# Patient Record
Sex: Male | Born: 1970
Health system: Southern US, Community
[De-identification: ages and names within clinical notes are randomized; demographics above are authoritative.]

## PROBLEM LIST (undated history)

## (undated) DIAGNOSIS — J309 Allergic rhinitis, unspecified: Secondary | ICD-10-CM

## (undated) DIAGNOSIS — D509 Iron deficiency anemia, unspecified: Secondary | ICD-10-CM

## (undated) DIAGNOSIS — K648 Other hemorrhoids: Secondary | ICD-10-CM

## (undated) DIAGNOSIS — E785 Hyperlipidemia, unspecified: Secondary | ICD-10-CM

## (undated) DIAGNOSIS — K922 Gastrointestinal hemorrhage, unspecified: Secondary | ICD-10-CM

## (undated) DIAGNOSIS — G8929 Other chronic pain: Secondary | ICD-10-CM

## (undated) DIAGNOSIS — M549 Dorsalgia, unspecified: Secondary | ICD-10-CM

## (undated) DIAGNOSIS — Z9289 Personal history of other medical treatment: Secondary | ICD-10-CM

## (undated) HISTORY — DX: Gastrointestinal hemorrhage, unspecified: K92.2

## (undated) HISTORY — PX: COLONOSCOPY: SHX174

## (undated) HISTORY — PX: ESOPHAGOGASTRODUODENOSCOPY: SHX1529

## (undated) HISTORY — DX: Dorsalgia, unspecified: M54.9

## (undated) HISTORY — DX: Other chronic pain: G89.29

## (undated) HISTORY — DX: Hyperlipidemia, unspecified: E78.5

## (undated) HISTORY — DX: Allergic rhinitis, unspecified: J30.9

## (undated) HISTORY — DX: Personal history of other medical treatment: Z92.89

## (undated) HISTORY — DX: Iron deficiency anemia, unspecified: D50.9

## (undated) HISTORY — PX: OTHER SURGICAL HISTORY: SHX169

## (undated) HISTORY — DX: Other hemorrhoids: K64.8

---

## 2005-11-25 ENCOUNTER — Ambulatory Visit: Payer: Self-pay

## 2011-12-30 DIAGNOSIS — K922 Gastrointestinal hemorrhage, unspecified: Secondary | ICD-10-CM

## 2011-12-30 HISTORY — PX: HEMORRHOID BANDING: SHX5850

## 2011-12-30 HISTORY — DX: Gastrointestinal hemorrhage, unspecified: K92.2

## 2012-02-27 ENCOUNTER — Ambulatory Visit: Payer: Self-pay | Admitting: Internal Medicine

## 2012-03-10 ENCOUNTER — Inpatient Hospital Stay: Payer: Self-pay | Admitting: *Deleted

## 2012-03-10 LAB — COMPREHENSIVE METABOLIC PANEL
Albumin: 3.9 g/dL (ref 3.4–5.0)
Alkaline Phosphatase: 50 U/L (ref 50–136)
BUN: 10 mg/dL (ref 7–18)
Bilirubin,Total: 0.7 mg/dL (ref 0.2–1.0)
Calcium, Total: 8.5 mg/dL (ref 8.5–10.1)
Chloride: 105 mmol/L (ref 98–107)
Creatinine: 0.82 mg/dL (ref 0.60–1.30)
EGFR (African American): >60
EGFR (Non-African Amer.): >60
Glucose: 89 mg/dL (ref 65–99)
SGPT (ALT): 22 U/L
Total Protein: 7 g/dL (ref 6.4–8.2)

## 2012-03-10 LAB — URINALYSIS, COMPLETE
Bacteria: NONE SEEN
Blood: NEGATIVE
Glucose,UR: NEGATIVE mg/dL (ref 0–75)
Nitrite: NEGATIVE
Ph: 7 (ref 4.5–8.0)
Protein: NEGATIVE
Specific Gravity: 1.012 (ref 1.003–1.030)
WBC UR: 1 /HPF (ref 0–5)

## 2012-03-10 LAB — CBC
MCH: 15.7 pg — ABNORMAL LOW (ref 26.0–34.0)
RBC: 2.64 10*6/uL — ABNORMAL LOW (ref 4.40–5.90)
RDW: 19.4 % — ABNORMAL HIGH (ref 11.5–14.5)
WBC: 7.1 10*3/uL (ref 3.8–10.6)

## 2012-03-10 LAB — IRON AND TIBC
Iron Bind.Cap.(Total): 512 ug/dL — ABNORMAL HIGH (ref 250–450)
Iron Saturation: 2 %

## 2012-03-10 LAB — TROPONIN I: Troponin-I: <0.02 ng/mL

## 2012-03-11 LAB — CBC WITH DIFFERENTIAL/PLATELET
Eosinophil #: 0.1 10*3/uL (ref 0.0–0.7)
Eosinophil %: 1.8 %
Lymphocyte #: 2.1 10*3/uL (ref 1.0–3.6)
Lymphocyte %: 31.8 %
MCV: 63 fL — ABNORMAL LOW (ref 80–100)
Monocyte #: 0.6 10*3/uL (ref 0.0–0.7)
Monocyte %: 9.3 %
Neutrophil %: 56.2 %
Platelet: 262 10*3/uL (ref 150–440)
RBC: 3.02 10*6/uL — ABNORMAL LOW (ref 4.40–5.90)
RDW: 27.6 % — ABNORMAL HIGH (ref 11.5–14.5)
WBC: 6.7 10*3/uL (ref 3.8–10.6)

## 2012-03-11 LAB — COMPREHENSIVE METABOLIC PANEL
Albumin: 3.4 g/dL (ref 3.4–5.0)
Alkaline Phosphatase: 43 U/L — ABNORMAL LOW (ref 50–136)
Anion Gap: 12 (ref 7–16)
BUN: 8 mg/dL (ref 7–18)
Calcium, Total: 8.2 mg/dL — ABNORMAL LOW (ref 8.5–10.1)
Chloride: 106 mmol/L (ref 98–107)
EGFR (Non-African Amer.): >60
Potassium: 4 mmol/L (ref 3.5–5.1)
SGOT(AST): 14 U/L — ABNORMAL LOW (ref 15–37)
Total Protein: 6.2 g/dL — ABNORMAL LOW (ref 6.4–8.2)

## 2012-03-12 LAB — CBC WITH DIFFERENTIAL/PLATELET
Basophil #: 0.1 10*3/uL (ref 0.0–0.1)
Basophil %: 1.2 %
Eosinophil #: 0.1 10*3/uL (ref 0.0–0.7)
HCT: 22.3 % — ABNORMAL LOW (ref 40.0–52.0)
HGB: 6.7 g/dL — ABNORMAL LOW (ref 13.0–18.0)
Lymphocyte %: 22.3 %
MCHC: 30.2 g/dL — ABNORMAL LOW (ref 32.0–36.0)
Neutrophil %: 68.2 %
Platelet: 249 10*3/uL (ref 150–440)
RDW: 30.4 % — ABNORMAL HIGH (ref 11.5–14.5)
WBC: 7.5 10*3/uL (ref 3.8–10.6)

## 2012-03-12 LAB — HEPATIC FUNCTION PANEL A (ARMC)
Alkaline Phosphatase: 43 U/L — ABNORMAL LOW (ref 50–136)
Bilirubin,Total: 1.1 mg/dL — ABNORMAL HIGH (ref 0.2–1.0)
SGOT(AST): 11 U/L — ABNORMAL LOW (ref 15–37)
SGPT (ALT): 17 U/L

## 2012-03-13 LAB — COMPREHENSIVE METABOLIC PANEL
Albumin: 3.4 g/dL (ref 3.4–5.0)
Alkaline Phosphatase: 43 U/L — ABNORMAL LOW (ref 50–136)
Anion Gap: 8 (ref 7–16)
Bilirubin,Total: 0.8 mg/dL (ref 0.2–1.0)
Calcium, Total: 8.1 mg/dL — ABNORMAL LOW (ref 8.5–10.1)
Chloride: 110 mmol/L — ABNORMAL HIGH (ref 98–107)
Co2: 24 mmol/L (ref 21–32)
Creatinine: 0.68 mg/dL (ref 0.60–1.30)
EGFR (African American): >60
EGFR (Non-African Amer.): >60
Glucose: 88 mg/dL (ref 65–99)
Osmolality: 280 (ref 275–301)
Potassium: 3.8 mmol/L (ref 3.5–5.1)
Sodium: 142 mmol/L (ref 136–145)
Total Protein: 6.1 g/dL — ABNORMAL LOW (ref 6.4–8.2)

## 2012-03-13 LAB — CBC WITH DIFFERENTIAL/PLATELET
Eosinophil %: 5.7 %
HCT: 24.8 % — ABNORMAL LOW (ref 40.0–52.0)
HGB: 7.3 g/dL — ABNORMAL LOW (ref 13.0–18.0)
Lymphocyte #: 1.6 10*3/uL (ref 1.0–3.6)
Lymphocyte %: 25 %
MCH: 20.4 pg — ABNORMAL LOW (ref 26.0–34.0)
MCHC: 29.6 g/dL — ABNORMAL LOW (ref 32.0–36.0)
MCV: 69 fL — ABNORMAL LOW (ref 80–100)
Monocyte #: 0.6 10*3/uL (ref 0.0–0.7)
Monocyte %: 8.8 %
Neutrophil %: 60.2 %
Platelet: 219 10*3/uL (ref 150–440)
RDW: 30.4 % — ABNORMAL HIGH (ref 11.5–14.5)
WBC: 6.4 10*3/uL (ref 3.8–10.6)

## 2012-03-15 ENCOUNTER — Ambulatory Visit: Payer: Self-pay | Admitting: Internal Medicine

## 2012-03-15 LAB — PATHOLOGY REPORT

## 2012-03-19 LAB — RETICULOCYTES: Reticulocyte: 2.3 % — ABNORMAL HIGH (ref 0.5–1.5)

## 2012-03-29 ENCOUNTER — Ambulatory Visit: Payer: Self-pay | Admitting: Internal Medicine

## 2012-03-31 LAB — CANCER CENTER HEMOGLOBIN: HGB: 10.6 g/dL — ABNORMAL LOW (ref 13.0–18.0)

## 2012-03-31 LAB — BASIC METABOLIC PANEL
BUN: 11 mg/dL (ref 7–18)
Chloride: 103 mmol/L (ref 98–107)
Co2: 31 mmol/L (ref 21–32)
Creatinine: 0.97 mg/dL (ref 0.60–1.30)
EGFR (Non-African Amer.): >60
Glucose: 87 mg/dL (ref 65–99)
Potassium: 3.7 mmol/L (ref 3.5–5.1)

## 2012-03-31 LAB — MAGNESIUM: Magnesium: 1.8 mg/dL

## 2012-04-14 LAB — CBC CANCER CENTER
Basophil %: 0.8 %
Eosinophil #: 0.2 x10 3/mm (ref 0.0–0.7)
Eosinophil %: 2.1 %
HCT: 32.1 % — ABNORMAL LOW (ref 40.0–52.0)
HGB: 9.7 g/dL — ABNORMAL LOW (ref 13.0–18.0)
MCHC: 30.2 g/dL — ABNORMAL LOW (ref 32.0–36.0)
Monocyte #: 0.6 x10 3/mm (ref 0.2–1.0)
Monocyte %: 8.6 %
Neutrophil #: 4.7 x10 3/mm (ref 1.4–6.5)
Platelet: 342 x10 3/mm (ref 150–440)
WBC: 7.6 x10 3/mm (ref 3.8–10.6)

## 2012-04-28 ENCOUNTER — Ambulatory Visit: Payer: Self-pay | Admitting: Internal Medicine

## 2012-04-29 LAB — CANCER CENTER HEMOGLOBIN: HGB: 12.5 g/dL — ABNORMAL LOW (ref 13.0–18.0)

## 2012-05-12 LAB — CANCER CENTER HEMOGLOBIN: HGB: 11.7 g/dL — ABNORMAL LOW (ref 13.0–18.0)

## 2012-05-26 LAB — CANCER CENTER HEMOGLOBIN: HGB: 11.1 g/dL — ABNORMAL LOW (ref 13.0–18.0)

## 2012-05-26 LAB — FERRITIN: Ferritin (ARMC): 13 ng/mL (ref 8–388)

## 2012-05-29 ENCOUNTER — Ambulatory Visit: Payer: Self-pay | Admitting: Internal Medicine

## 2012-07-14 DIAGNOSIS — K648 Other hemorrhoids: Secondary | ICD-10-CM | POA: Insufficient documentation

## 2012-07-14 DIAGNOSIS — D649 Anemia, unspecified: Secondary | ICD-10-CM | POA: Insufficient documentation

## 2013-01-18 IMAGING — NM NUCLEAR MEDICINE MECKELS SCAN
2 series · 13 of 13 positions shown · non-contrast
Comparison: none

REASON FOR EXAM: GI blood loss of undetermined origin with neg EGD and
colonoscopy
COMMENTS:

[Series 1000: (person_name) statics · 2.40mm/px · 7 of 7 slices shown]
[im 1/7]
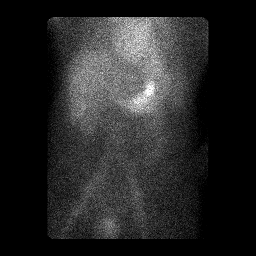
[im 2/7]
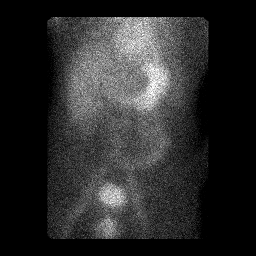
[im 3/7]
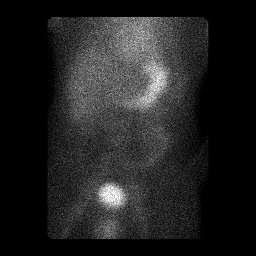
[im 4/7]
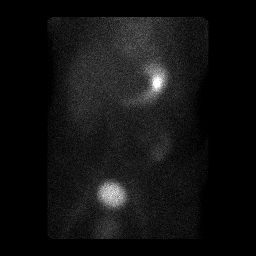
[im 5/7]
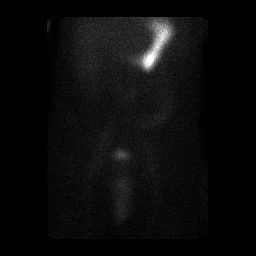
[im 6/7]
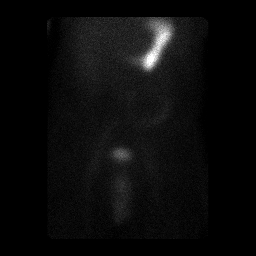
[im 7/7]
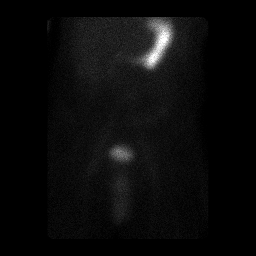

[Series 1000: (person_name) dynamic · 4.80mm/px · 6 of 12 frames shown]
[frame 2/12]
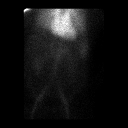
[frame 4/12]
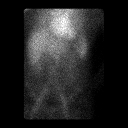
[frame 6/12]
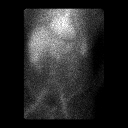
[frame 8/12]
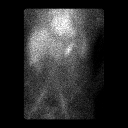
[frame 10/12]
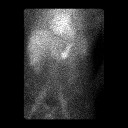
[frame 12/12]
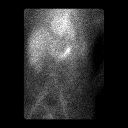

[13 of 13 positions shown; findings below may reference images not displayed]

PROCEDURE:     NM  - NM SRZ LOCALIZATION SCAN  - [DATE] [DATE] [DATE]  [DATE]

RESULT:     Following a trace amount straight and 15 mCi technetium 99 M
pertechnetate, serial views 11 retained up to one hour. There is observed
normal distribution of tracer activity in the stomach , duodenum and the
urinary bladder. No abnormal focal areas of increased tracer activity
suspicious for Shim diverticulum identified.
IMPRESSION: 1. Normal study. No Meckel's diverticulum is identified.

## 2015-04-22 NOTE — Consult Note (Signed)
Chief Complaint:   Subjective/Chief Complaint Feels well. No BM. Hemoglobin better at 7.3. Capsule study in progress.  Recommendations: Regular diet this evening. May go home today after the capsule study is complete. Patient should call Dr. Percell [place] office Monday to make an appointment within 2-3 days.   VITAL SIGNS/ANCILLARY NOTES: **Vital Signs.:   16-Mar-13 09:44   Vital Signs Type Q 4hr   Temperature Temperature (F) 98.2   Celsius 36.7   Temperature Source oral   Pulse Pulse 63   Pulse source per Dinamap   Respirations Respirations 20   Systolic BP Systolic BP 850   Diastolic BP (mmHg) Diastolic BP (mmHg) 67   Mean BP 83   BP Source Dinamap   Pulse Ox % Pulse Ox % 97   Pulse Ox Activity Level  At rest   Oxygen Delivery Room Air/ 21 %   Electronic Signatures: Jill Side (MD)  (Signed 16-Mar-13 13:18)  Authored: Chief Complaint, VITAL SIGNS/ANCILLARY NOTES   Last Updated: 16-Mar-13 13:18 by Jill Side (MD)

## 2015-04-22 NOTE — Discharge Summary (Signed)
PATIENT NAME:  Chris [name], Chris [name] MR#:  [id number] DATE OF BIRTH:  1971/08/26  DATE OF ADMISSION:  03/10/2012 DATE OF DISCHARGE:  03/13/2012  DISCHARGE DIAGNOSIS: Severe iron deficiency anemia status post packed red blood cells transfusion status post upper and lower GI endoscopy.   CONSULTATIONS:  1. GI, Dr. Vira Agar. 2. Hematology, Dr. Inez Pilgrim.   PROCEDURES DONE: Upper GI endoscopy, colonoscopy and capsule endoscopy.   HOSPITAL COURSE: A 44 year old male with no significant past medical history. He was referred from [facility] because of anemia. He was recently having some cough and cold symptoms and lab studies were done at [facility] showed severe anemia and patient was sent to the Emergency Room. He was complaining of some mild rectal bleeding off and on when he wipes. He was admitted as severe iron deficiency anemia with possible GI blood loss. When he came in his hemoglobin was found to be 4.1 microcytic with MCV of 57. His platelet count and white count were normal; white count 7.1 and platelet count 328,000. When he came in his BUN was normal at 10. LFTs are essentially normal. Iron studies showed that he had ferritin of less than 1, TIBC elevated at 512, serum iron 8 mcg/dL and iron saturation of only 2%. GI was consulted. His guaiac was negative. Dr. Candace Cruise saw him as GI consultant. Patient was taken for upper GI endoscopy in 03/11/2012 that was essentially normal, normal esophagus, normal stomach and normal duodenum. Patient was taken for colonoscopy on 03/12/2012 and only showed prominent internal hemorrhoids. Distal terminal ileum showed some lymphoid hyperplasia and was biopsied. The biopsy is pending. Celiac panel and Meckel's scan was done because of severe iron deficiency anemia. The celiac disease panel came back as negative. His Meckel's scan is negative, normal study. His chest x-ray when he came in was negative. He had beta strep throat culture that was done, negative. Hematology  was also consulted because of his severe iron deficiency anemia. He does not seem to have a hematological problem going on. There is no sign or suspicion for any hematological problems. He does not need IV iron right now. He suggested to start the patient on a combination of iron polysaccharide and ferrous fumarate tandem. I am going to start him on that along with multivitamin Tandem Plus. He received a total of 4 units of packed RBC transfusion during the hospital stay. His hemoglobin had improved to 7.3. He is hemodynamically stable. He is saturating on room air.Marland Kitchen His hemoglobin needs to be monitored when he follows with GI and hematology. He is getting a capsule endoscopy at the time of discharge.   MEDICATIONS AT DISCHARGE/HOME MEDICATIONS:  1. Allegra-D 180 mg daily. 2. Nasonex 2 sprays nasal once a day at bedtime.  3. New medications:  4. Tandem Plus 1 capsule p.o. b.i.d. which is a combination of iron polysaccharide, ferrous fumarate and multivitamin.    DIET: Eat light for the first meal.   CONDITION AT DISCHARGE: Stable, comfortable. T-max 98.2, heart rate 67, blood pressure 118/71, saturating 98% on room air. Chest is clear. Abdomen soft, nontender.   FOLLOW UP: Patient should follow up with Dr. In[facility] Pilgrim on [facility]. Please call for appointment on Monday and follow up with Dr. Vira Agar in 2 to 3 days.   TIME SPENT WITH DISCHARGE: 40 minutes.  ____________________________ Mena Pauls, MD ag:cms D: 03/13/2012 16:05:06 ET T: 03/15/2012 10:52:23 ET JOB#: [id number]  cc: Mena Pauls, MD, <Dictator> Simonne Come. Inez Pilgrim, MD Gavin Pound.  Vira Agar, MD Mena Pauls MD ELECTRONICALLY SIGNED 03/23/2012 15:11

## 2015-04-22 NOTE — Consult Note (Signed)
Pt colonoscopy showed only prominent internal hemorrhoids.  Distal terminal ileum showed lymphoid hyperplasia and was biopsied. to severe iron def will consult hematology, check celiac panel, do Meckel scan today and capsule study tomorrow.  Discussed with patient and wife.  Electronic Signatures: Manya Silvas (MD)  (Signed on 15-Mar-13 12:53)  Authored  Last Updated: 15-Mar-13 12:53 by Manya Silvas (MD)

## 2015-04-22 NOTE — Consult Note (Signed)
Full consult to follow. Rectal bleeding over few months. Recalls having colonoscopy 8-9 yrs ago. Pt does not know the results, nor can we find report in hospital records. Occasional NSAIDS use. Transfused 2 units last night. Another unit being given today. Hx suggests more LGI bleed than UGI. Will try to locate old colonoscopy report. May consider EGD 1st today. If neg, then bowel prep later today for colon tomorrow. THanks.  Electronic Signatures: Verdie Shire (MD)  (Signed on 14-Mar-13 08:05)  Authored  Last Updated: 14-Mar-13 08:05 by Verdie Shire (MD)

## 2015-04-22 NOTE — Consult Note (Signed)
PATIENT NAME:  Chris [name], Chris [name] MR#:  [id number] DATE OF BIRTH:  May 01, 1971  DATE OF CONSULTATION:  03/12/2012  REFERRING PHYSICIAN:   CONSULTING PHYSICIAN:  Simonne Come. Inez Pilgrim, MD  HISTORY OF PRESENT ILLNESS: [name] is a 44 year old patient who was admitted on 03/10/2012 with significant anemia and mildly symptomatic. He admitted to having some lightheadedness and shortness of breath on exertion when he only carried his daughter upstairs with symptoms for a few weeks and on questioning admitted to reddish stools for a few weeks, dark stools on occasion, not too long ago. Using Advil p.r.n. recently. One episode of forceful heartbeat or palpitation a few weeks earlier and when he had labs done at [facility], because of these symptoms, his hemoglobin was 3.9. His iron and ferritin levels were extremely low. He was sent to the hospital. He was initially transfused two and then a third unit of blood. Hemoglobin came up to 6.7. He was then transfused a fourth unit of blood after which I evaluated him. I discussed his case with his primary physician, Dr. Holley Raring, and with Dr. Vira Agar. The patient had already undergone a colonoscopy and an EGD. He had internal hemorrhoids. It was thought to be the source of bleeding. He also had some lymphoid hyperplasia in one area that was biopsied and that result is pending. He tolerated the procedures well. He again was only minimally symptomatic even with the low hemoglobin and at rest not ever been symptomatic and when I saw him he felt completely normal, vigorous. He otherwise denied any headache, dizziness, vertigo, visual disturbances, ear or jaw pain, cough, shortness of breath, chest pain, palpitations, wheezing, hemoptysis, back pain, bone pain, abdominal pain, nausea, vomiting, diarrhea, edema, joint swelling, rash, bruising, or focal weakness.   PAST MEDICAL HISTORY: He has had positive stools and sigmoidoscopy years ago.   DRUG ALLERGIES: No known drug  allergies.   MEDICATIONS: He was recently taking some Nasonex and Allegra D.   REVIEW OF SYSTEMS: He has recently just had cold symptoms with some nonproductive cough. No other pharyngitis. He complained of a dry throat briefly. He had his throat cultured and did not have fever; this is really self-limiting.  PAST SURGICAL HISTORY:  No prior surgeries.   FAMILY HISTORY: No cancer or blood disorders in the family.   SOCIAL HISTORY: No alcohol. No smoking.   PHYSICAL EXAMINATION:   GENERAL: He was alert and cooperative, in no acute distress.   EYES: Sclerae no jaundice.   MOUTH: No thrush.   NECK: No palpable lymph nodes in the supraclavicular, submandibular, or axillae.   LUNGS: Clear. No wheezing or rales.   HEART: Regular.   ABDOMEN: Nontender. No palpable mass or organomegaly.   EXTREMITIES: No extremity edema. No swollen or deformed joints.   NEUROLOGIC: Grossly nonfocal. Cranial nerves are intact. Moving all extremities against gravity. I did not test his gait.   PSYCHIATRIC: Affect normal.   LABS/STUDIES: EKG in the ER was reported as normal.   As an outpatient, his hemoglobin was 3.9 with MCV of 61. The calcium was slightly low at 8.2 and vitamin D was low. He had normal electrolytes and liver chemistries and a troponin. His white count was 7.1, hemoglobin was 4.1, platelets 328, and MCV was 57.   He then had transfusion and hemoglobin came up to 6.7.   He has undergone colonoscopy and endoscopy already as noted.   IMPRESSION AND PLAN: The patient is with clear iron deficiency, low oral, low ferritin,  and low MCV. He has been transfused blood. He does not need any additional transfusions. He is very comfortable at rest. He is followed by gastroenterology and they planned Meckel's diverticulum scan which was actually done and reported normal. He is planned for capsule study. He will have appropriate GI follow-up depending on if a lesion is found or if the scan is  negative. He will have follow-up of the biopsy of the lymphoid hyperplasia. He will have calcium checked and be given appropriate vitamin D replacement and that will be deferred to Medicine. From the hematology point of view, there is no sign or suspicion currently that he has any hematology of blood or bone marrow disease, except that resulting from gastrointestinal blood loss. There is no indication for IV iron now. He will need to start p.o. iron which he has been cleared to do by Gastroenterology after his capsule study and he will start that on Sunday. He should take iron twice a day initially. I will see him in the [place] within a few days of his discharge and then check his hemoglobin to confirm that it is improving. He will need to be followed to confirm that his hemoglobin does indeed normalize on oral iron and then we would be certain that there is no other underlying or other contributing causes of anemia. Then would defer to Gastroenterology whether he should have repeat luminal studies at some time or if the findings and the history are compatible with his degree of blood loss. I discussed these issues with the patient and his wife.  ____________________________ Simonne Come. Inez Pilgrim, MD rgg:slb D: 03/13/2012 11:20:00 ET T: 03/13/2012 12:28:33 ET JOB#: [id number]  cc: Simonne Come. Inez Pilgrim, MD, <Dictator> Dallas Schimke MD ELECTRONICALLY SIGNED 03/19/2012 13:47

## 2015-04-22 NOTE — Consult Note (Signed)
Brief Consult Note: Diagnosis: ida from chronic and subacute gi bleed.   Patient was seen by consultant.   Discussed with Attending MD.   Comments: See dictated note to follow  discussed with dr Holley Raring and dr Vira Agar. no evidence or suspicion currently of other underlying hematologic disease. Will need to start po iron on sunday after capsule study completed, prefer ferrous gluconate/iron polysacharide bid, also folic acid po. If discharged sunday should see me in cancer center to check hgb on thursday 3/21. Will need to follow to confirm that hgb normalizes. Currently no indication for IV iron.  Electronic Signatures: Dallas Schimke (MD)  (Signed 15-Mar-13 18:49)  Authored: Brief Consult Note   Last Updated: 15-Mar-13 18:49 by Dallas Schimke (MD)

## 2015-04-22 NOTE — Consult Note (Signed)
Meckel scan was neg.  Plan capsule study tomorrow.  If neg consider hemorrhoid removal.  Heme onc to see.  Electronic Signatures: Manya Silvas (MD)  (Signed on 15-Mar-13 17:24)  Authored  Last Updated: 15-Mar-13 17:24 by Manya Silvas (MD)

## 2015-04-22 NOTE — Consult Note (Signed)
PATIENT NAME:  Chris [name], Chris [name] MR#:  [id number] DATE OF BIRTH:  1971/12/16  DATE OF CONSULTATION:  03/11/2012  REFERRING PHYSICIAN:   CONSULTING PHYSICIAN:  Lupita Dawn. Ayaz Sondgeroth, MD  REASON FOR REFERRAL: Severe iron deficiency anemia and rectal bleeding.   HISTORY OF PRESENT ILLNESS: The patient is a 44 year old white male with a history of rectal bleeding. He describes having sometimes bright blood, sometimes maroon color, and sometimes dark stools. He initially told the admitting doctor that it has been going on for few weeks. However, when I talked to him this has been going on for several months already. He presented to the [facility] in [place] because of some cold-like symptoms. The patient was found to be severely anemic with hemoglobin of only 3.9 and iron level was 6. Therefore, the patient was brought in for blood transfusion and further evaluation.   The patient recalls having a procedure done by Dr. Sonny Masters some years ago because of possible heme-positive stool. He did not know the results at all.    REVIEW OF SYSTEMS: No fevers or chills. He does complain of some weakness and fatigue but there is no chest pain or palpitation. No coughing or shortness of breath. There is no abdominal pain. He did not have any indigestion, abdominal pain, heartburn, or nausea.  There is some bleeding. He does admit to having some hemorrhoids.   ALLERGIES: He has no known drug allergies.   PAST SURGICAL HISTORY: None.   FAMILY HISTORY: Notable for diabetes and hypertension and coronary artery disease.   SOCIAL HISTORY: No smoking or alcohol use. He does admit to taking some ibuprofen periodically for soreness here and there.   PHYSICAL EXAMINATION:  GENERAL: The patient is in no acute distress. He was given at least 2 units last night and he was being given another unit this morning when I saw him.   VITAL SIGNS: This morning temperature 98.4, pulse 79, respirations 18, blood pressure 117/62, pulse oximetry  97%.   HEENT: Normocephalic, atraumatic head. Pupils are equally reactive. Throat was clear.   NECK: Supple.   CARDIAC: Regular rhythm and rate without murmurs.   LUNGS: Lungs are clear bilaterally.   ABDOMEN: Normoactive bowel sounds. Soft, nontender. There is no hepatomegaly. There are no palpable masses.   EXTREMITIES: No clubbing, cyanosis, or edema.   NEUROLOGIC: The patient was alert and intact.   LABORATORY DATA: Sodium 141, potassium 4, chloride 106, CO2 23, BUN 8, creatinine 0.78, glucose 83. Liver enzymes were normal yesterday, although today slightly up with bilirubin 1.3. Troponin level is normal. Hemoglobin here was only 4.1. After 2 units it was up to 5.6, MCV 57. Ferritin level was less than 1, iron level was 8.   ASSESSMENT AND PLAN: This is a patient with severe iron deficiency anemia with some hematochezia. This history suggests lower gastrointestinal bleeding. However, with the use of NSAIDs, he could have upper gastrointestinal bleeding as well. I was finally able to locate his procedure note.  He had a flexible sigmoidoscopy by Dr. Sonny Masters in March 2007. At the time only a flexible sigmoidoscopy was performed. The colon itself looked normal, but he did have some bleeding external hemorrhoids.   Because I just saw him today and he is n.p.o. since midnight we will try to do an upper endoscopy to at least rule out upper gastrointestinal bleeding. If upper endoscopy is normal then we will prep him for a colonoscopy tomorrow. We may also consider a hematologic evaluation if no obvious source  of bleeding is found. Thank you for the referral.    ____________________________ Lupita Dawn. Candace Cruise, MD pyo:bjt D:  03/11/2012 17:47:33 ET          T: 03/12/2012 09:04:37 ET        JOB#: [id number]  Lupita Dawn Lasheba Stevens MD ELECTRONICALLY SIGNED 03/15/2012 9:03

## 2015-04-22 NOTE — Consult Note (Signed)
Able to locate colon report. Had flex sigm in 3/07 by Dr. Sonny Masters. Had external hemorrhoids only. No full colonoscopy performed.  Electronic Signatures: Verdie Shire (MD)  (Signed on 14-Mar-13 08:14)  Authored  Last Updated: 14-Mar-13 08:14 by Verdie Shire (MD)

## 2015-04-22 NOTE — Consult Note (Signed)
After capsule endoscopy recording device removed tomorrow afternoon he can eat regular food and go home if you think he is stable.  Electronic Signatures: Manya Silvas (MD)  (Signed on 15-Mar-13 18:05)  Authored  Last Updated: 15-Mar-13 18:05 by Manya Silvas (MD)

## 2015-04-22 NOTE — H&P (Signed)
PATIENT NAME:  Chris, [name] MR#:  [id number] DATE OF BIRTH:  09-30-1971  DATE OF ADMISSION:  03/10/2012  PRIMARY CARE PHYSICIAN: [place]   HISTORY OF PRESENT ILLNESS: Patient is a 44 year old Caucasian male with past medical history significant for history of blood in the stool in the past, status post colonoscopy approximately 8 to 9 years ago by Dr. Neysa Bonito which was unremarkable presented to the hospital with complaints of anemia. Apparently patient started having cough and having cold symptoms and [facility] [place]. She did lab studies which revealed severe anemia and patient was sent to Emergency Room for further evaluation. Patient admits of having some lightheadedness as well as shortness of breath especially whenever he was carrying his daughter just recently up the stairs. He has been having those symptoms for the past 2 or 3 weeks. He is also noted to be pale for the past two weeks according to patient's wife. He denies any other significant symptoms, however, admitted of reddish stools for the past 2 or 3 weeks now. He denies any nausea and vomiting. Denies any abnormality with p.o. intake, however, admits of having intermittent NSAIDs use. He has been Advil maybe one pill every so often maybe three times a month for minor pain. He has been also admitting of fatigue and weakness. Admitting of palpitations in his chest as well as feeling presyncopal approximately two weeks ago. Because of this as well as anemia on his labs he decided to come to Emergency Room where hospitalist services were consulted for admission. Patient's hemoglobin done by [facility] was 3.9. Patient's iron level was found to be low at 6. Patient was also noted to be vitamin D deficient with vitamin D 24-hydroxy level low at 20.4. Guaiac of stool was done here in Emergency Room which tested negative.   PAST MEDICAL HISTORY: History of guaiac-positive stools status post colonoscopy  approximately 8 or 9 years ago by Dr. Neysa Bonito which was negative according to patient's recollection.  MEDICATIONS: Patient was recently placed on Allegra-D as well as Nasonex and cough syrup, unknown doses or frequencies.   ALLERGIES: None.   PAST SURGICAL HISTORY: None.   FAMILY HISTORY: Hypertension in patient's mother. Diabetes in patient's father who had also coronary artery bypass graft age of 59, also kidney stones. No cancers in the family or early coronary artery disease.   SOCIAL HISTORY: Patient is married, has 9.14 year old daughter. Denies any smoking, alcohol abuse. 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    REVIEW OF SYSTEMS: CONSTITUTIONAL: Positive for fatigue and weakness, having some blurring of vision in bright light, admits of having year-round allergies, some cough recently with no sputum production as well as dyspnea especially on exertion, also intermittent palpitations in his chest as well as feeling presyncopal two weeks ago. Had blood in his stool 8 or 9 years ago, which was unremarkable as much as he remembers according to colonoscopy. Denies any fevers, chills, pains, weight loss or gain. EYES: In regards to eyes denies any double vision, glaucoma, cataracts. ENT: Denies any tinnitus, allergies, epistaxis, sinus pain, dentures, difficulty swallowing. RESPIRATORY: Denies any wheezes, asthma, chronic obstructive pulmonary disease. CARDIOVASCULAR: Denies chest pain, orthopnea, arrhythmias. GASTROINTESTINAL: Denies nausea, vomiting, diarrhea or constipation. Admits of previously having rectal bleed 8 or 9 years ago for which he had colonoscopy. Admits of having reddish looking stools but admitted to Emergency Room physician having black stools intermittently over the past few weeks. GENITOURINARY: Denies  dysuria, hematuria, frequency, incontinence. ENDOCRINE: Denies any polydipsia, nocturia, thyroid problems, heat or cold intolerance or thirst. HEMATOLOGY: Denies anemia, easy bruising,  bleeding, swollen glands. SKIN: Denies any acne, rashes, lesions, change in moles. MUSCULOSKELETAL: Denies arthritis, cramps, swelling, gout. NEUROLOGIC: No numbness, epilepsy, tremor. PSYCH: Denies anxiety, insomnia, depression.   PHYSICAL EXAMINATION:  VITAL SIGNS: On arrival to the hospital patient's vitals: Temperature 97.3, pulse 89, respiration rate 18, blood pressure 142/53, saturation 100% on room air.   GENERAL: This is a well nourished, pale Caucasian male in no significant distress lying on the stretcher.   HEENT: His pupils are equal, reactive to light. Extraocular movements are intact. No icterus or conjunctivitis. Has normal hearing. No pharyngeal erythema. Mucosa is moist. Some redness of soft palpate was noted.   NECK: Neck did not reveal any masses. Supple, nontender. Thyroid not enlarged. No adenopathy. No JVD or carotid bruits bilaterally. Full range of motion.   LUNGS: Clear to auscultation in all fields. No rales, rhonchi, diminished breath sounds or wheezing. No labored inspirations, increased effort, dullness to percussion, overt respiratory distress.  CARDIOVASCULAR: S1, S2 appreciated. No murmurs, gallops or rubs noted. PMI not lateralized. Chest is nontender to palpation. 1+ pedal pulses. No lower extremity edema, calf tenderness, or cyanosis noted.   ABDOMEN: Abdomen is soft. Bowel sounds are present. No hepatosplenomegaly or masses are noted.  RECTAL: Done by Emergency Room physician was heme negative.   MUSCULOSKELETAL: 5/5 in lower extremities. No cyanosis, degenerative joint disease, or kyphosis. Gait is not tested.   SKIN: Skin did not reveal any rashes, lesions, erythema, nodularity, induration. It was warm and dry to palpation.   LYMPH: No adenopathy in cervical region.   NEUROLOGICAL: Cranial nerves grossly intact. Sensory is intact. No dysarthria, aphasia.   PSYCHIATRIC: Patient is alert, oriented to time, person, place, cooperative. Memory is good. No  significant confusion, agitation, depression noted.   LABORATORY, DIAGNOSTIC AND RADIOLOGICAL DATA: EKG done in the Emergency Room showed normal sinus rhythm at 80 beats per minute, normal axis, no acute ST-T changes. Lab studies from [facility] showed hemoglobin 3.9, MCV 61, red blood cell count 2.66, iron level is low at 6, calcium level low at 8.2, cholesterol level total 83, HDL low at 30, vitamin D level 25-hydroxy 20.4, which is also low. Here in the Emergency Room patient's BMP within normal limits. Patient's liver enzymes were unremarkable. Troponin less than 0.02. White blood cell count is normal at 7.1, hemoglobin 4.1, platelets 328, MCV low at 57. Radiologic studies: None.   ASSESSMENT AND PLAN:  1. Severe iron deficiency anemia. Admit patient to medical floor. Transfuse him. This was discussed with patient transfusion risks as well as benefits discussed with patient and he was agreeable for transfusion. Will transfuse him with 2 units of packed red blood cells. Will follow patient's hemoglobin levels and make decisions as we go. Patient will be started on iron supplementations as soon as investigation is complete.  2. Suspected GI bleed, chronic GI blood loss, very likely due to nonsteroidal anti-inflammatory medications as patient is using nonsteroidal anti-inflammatory medications. Will start patient on PPIs IV twice a day. Will get gastroenterologist involved for further recommendations.  3. Cough. Will continue suppressants, very likely viral infection related.  4. Vitamin D deficiency. Will start patient on vitamin D supplements upon discharge home.  5. Dyspnea on exertion, very likely due to anemia.   TIME SPENT: 50 minutes.   ____________________________ Theodoro Grist, MD rv:cms D: 03/10/2012 15:16:23 ET T: 03/10/2012  15:52:03 ET JOB#: [id number]  cc: Theodoro Grist, MD, <Dictator> [place] MD ELECTRONICALLY SIGNED 03/20/2012 10:07

## 2015-04-22 NOTE — Consult Note (Signed)
EGD was completely normal. Will prep patient for colonoscopy tomorrow with Dr. Vira Agar. Thanks.  Electronic Signatures: Verdie Shire (MD)  (Signed on 14-Mar-13 17:36)  Authored  Last Updated: 14-Mar-13 17:36 by Verdie Shire (MD)

## 2015-08-22 ENCOUNTER — Ambulatory Visit (HOSPITAL_COMMUNITY)
Admission: RE | Admit: 2015-08-22 | Discharge: 2015-08-22 | Disposition: A | Discharge status: Home / Self Care | Payer: BLUE CROSS/BLUE SHIELD | Source: Ambulatory Visit | Attending: Internal Medicine | Admitting: Internal Medicine

## 2015-08-22 DIAGNOSIS — D649 Anemia, unspecified: Secondary | ICD-10-CM | POA: Insufficient documentation

## 2015-08-22 LAB — ABO/RH: ABO/RH(D): A POS

## 2015-08-22 LAB — PREPARE RBC (CROSSMATCH)

## 2015-08-22 MED ORDER — SODIUM CHLORIDE 0.9 % IV SOLN: Freq: Once | INTRAVENOUS | Status: DC

## 2015-08-22 MED ORDER — FUROSEMIDE 10 MG/ML IJ SOLN
20.0000 mg | Freq: Once | INTRAMUSCULAR | Status: AC
  Administered 2015-08-22: 20 mg via INTRAVENOUS

## 2015-08-22 MED ORDER — FUROSEMIDE 10 MG/ML IJ SOLN
INTRAMUSCULAR | Status: AC
  Filled 2015-08-22: qty 2

## 2015-08-23 ENCOUNTER — Telehealth: Payer: Self-pay

## 2015-08-23 LAB — TYPE AND SCREEN
ABO/RH(D): A POS
Antibody Screen: NEGATIVE
Unit division: 0
Unit division: 0

## 2015-08-23 NOTE — Telephone Encounter (Signed)
Patient can't come tomorrow.  I have scheduled him for 08/29/15 at 11:15

## 2015-08-23 NOTE — Telephone Encounter (Signed)
-----   Message from Gatha Mayer, MD sent at 08/23/2015  7:32 AM EDT ----- Regarding: appt tomorrow 330 Dr. Lang Snow wants me to see this patient re: chronic hemorrhoid bleeding Has been seen at [facility] but wants closer to home care He was transfused yesterday  If he is able please book him for 330 tomorrow   Thanks

## 2015-08-24 ENCOUNTER — Ambulatory Visit: Payer: BLUE CROSS/BLUE SHIELD | Admitting: Internal Medicine

## 2015-08-29 ENCOUNTER — Encounter: Payer: Self-pay | Admitting: Internal Medicine

## 2015-08-29 ENCOUNTER — Ambulatory Visit (INDEPENDENT_AMBULATORY_CARE_PROVIDER_SITE_OTHER): Payer: BLUE CROSS/BLUE SHIELD | Admitting: Internal Medicine

## 2015-08-29 VITALS — BP 104/60 | HR 72 | Ht 71.0 in | Wt 199.0 lb

## 2015-08-29 DIAGNOSIS — K642 Third degree hemorrhoids: Secondary | ICD-10-CM

## 2015-08-29 DIAGNOSIS — K648 Other hemorrhoids: Secondary | ICD-10-CM | POA: Diagnosis not present

## 2015-08-29 DIAGNOSIS — D500 Iron deficiency anemia secondary to blood loss (chronic): Secondary | ICD-10-CM

## 2015-08-29 NOTE — Assessment & Plan Note (Signed)
He has had banding and has had recurrent problems. I'm a little puzzled by the intermittent nature of his bleeding as a cause for his anemia but he's had this same problem for years it sounds like off and on and I don't think repeating a GI workup is needed given the findings and history. I think these are too complicated for the banding procedure I do and have suggested he be evaluated by colorectal surgery so we will refer to Dr. Leighton Ruff.

## 2015-08-29 NOTE — Assessment & Plan Note (Signed)
Continue iron supplementation, take twice a day if he can, follow-up with Dr. Brigitte Pulse regarding this. Definitive treatment of hemorrhoids pending. I think it's possible his iron was never completely ripped needed or just looked so.

## 2015-08-29 NOTE — Progress Notes (Signed)
Referred by Dr. Lutricia [name] Subjective:    Patient ID: Chris Chris [name], male    DOB: 01-21-1971, 44 y.o.   MRN: [id number] Chief complaint: Hemorrhoidal bleeding iron deficiency anemia HPI The patient is a 44 year old single white man with a history of iron deficiency anemia attributed to bleeding hemorrhoids. In 2013 he had an extensive evaluation at [facility] with a negative colonoscopy small bowel capsule endoscopy EGD Meckel scan and celiac testing were all negative. He did have hemorrhoids. He had hemorrhoidal banding in June 2013,Dr. Migaly of [facility] performed. He reports he had improved bleeding. All 3 columns had been banded. His hemoglobin recovered. He will took some iron supplements over time and he says he had annual hemoglobins it were normal including last year. However in the past month Chris [name] so he has noted recurrent bleeding from the hemorrhoids. Dr. Brigitte [name] discovered a hemoglobin of 5.6 with MCV 59.8. The patient was transfused 2 units of red cells last week and his hemoglobin was 7.9 he reports. He feels stronger. He is taking an iron supplement once a day. He does report intermittent prolapse and he has to push the hemorrhoids back in. He has anal tags symptoms as well. He denies straining to stool Chris [name] prolonged defecation. Though since starting the iron his stools may be a bit less frequent. He has been having large amounts of bright red blood squirted into the commode he thinks. Again all for about the past month and then may be minor over the past couple of years if at all. He does not donate blood. Prior to his workup in 2013 it was noted that he had a sigmoidoscopy 5 years prior to that for rectal bleeding.  GI review of systems is otherwise negative No Known Allergies  Current outpatient prescriptions:  .  ferrous fumarate (HEMOCYTE - 106 MG FE) 325 (106 FE) MG TABS tablet, Take 1 tablet by mouth., Disp: , Rfl:  .  fexofenadine (ALLEGRA) 180 MG tablet, Take 180 mg  by mouth daily., Disp: , Rfl:  .  mometasone (NASONEX) 50 MCG/ACT nasal spray, Place 2 sprays into the nose daily., Disp: , Rfl:    Past Medical History  Diagnosis Date  . Iron deficiency anemia   . Allergic rhinitis   . Chronic back pain   . Hyperlipidemia     borderline  . Transfusion history 2013, 2016  . Gastrointestinal bleed 2013    secondary to hemorrhoids  . Internal hemorrhoids    Past Surgical History  Procedure Laterality Date  . Hemorrhoid banding  2013    Done at [facility]  . Esophagogastroduodenoscopy    . Colonoscopy    . Capsule endoscopy     Social History   Social History  . Marital Status: Married    Spouse Name: N/A  . Number of Children: 1  . Years of Education: N/A   Occupational History  . [employer]    Social History Main Topics  . Smoking status: Never Smoker   . Smokeless tobacco: Never Used  . Alcohol Use: No  . Drug Use: No  . Sexual Activity: Not Asked   Other Topics Concern  . None   Social History Narrative   Single with one daughter.   Employed at [employer] [place] [employer]   2 caffeinated beverages a day   08/29/2015      Family History  Problem Relation Age of Onset  . Diabetes Father     Type 2  .  Coronary artery disease Father     s/p CABG  . Hypertension Mother   . Alzheimer's disease Maternal Grandmother   . Hypertension Maternal Grandmother   . Arthritis Maternal Grandmother   . Heart disease Paternal Grandfather     Review of Systems All other review is negative Chris [name] as per history of present illness    Objective:   Physical Exam @BP  104/60 mmHg  [name] 74  Ht 5\' 11"  (1.803 m)  Wt 199 lb (90.266 kg)  BMI 27.77 kg/m2@  General:  Well-developed, well-nourished and in no acute distress Eyes:  anicteric. Rectal: There are fleshy tags moderate-sized in all positions on inspection of the anoderm there is no dermatitis Digital exam shows anal stenosis with a significant amount of spasm and  a lengthened anal canal. There is mild to moderate tenderness. No mass no blood.  Anoscopy is performed in the left lateral decubitus position and shows grade 2internal hemorrhoids all positions. There are inflamed but not bleeding.   Neuro:  A&O x 3.  Psych:  appropriate mood and  Affect.   Data Reviewed:  As per history of present illness. I reviewed Dr. Raul [name] primary care note as well.      Assessment & Plan:  Prolapsed internal hemorrhoids, grade 3, with bleeding He has had banding and has had recurrent problems. I'm a little puzzled by the intermittent nature of his bleeding as a cause for his anemia but he's had this same problem for years it sounds like off and on and I don't think repeating a GI workup is needed given the findings and history. I think these are too complicated for the banding procedure I do and have suggested he be evaluated by colorectal surgery so we will refer to Dr. Leighton [name].  Anemia due to chronic blood loss from hemorrhoids Continue iron supplementation, take twice a day if he can, follow-up with Dr. Brigitte [name] regarding this. Definitive treatment of hemorrhoids pending. I think it's possible his iron was never completely ripped needed Chris [name] just looked so.  I appreciate the opportunity to care for this patient.  OX:BDZH, Chris Saxon, MD Chris Ruff, MD

## 2015-08-29 NOTE — Patient Instructions (Addendum)
You have been scheduled for an appointment with Dr. Leighton Ruff at [facility]. Your appointment is on _9/21/16__ at ___9:00 AM__. Please arrive at __8:30AM___ for registration. Make certain to bring a list of current medications, including any over the counter medications or vitamins. Also bring your co-pay if you have one as well as your insurance cards. [facility] is located at [address], [address]. Should you need to reschedule your appointment, please contact them at [phone].    I appreciate the opportunity to care for you. Silvano Rusk, MD, [facility]    P.S.- Patient informed of [facility] appointment after he left, said he will have to call and R/S because in [place] that day.

## 2015-10-02 ENCOUNTER — Other Ambulatory Visit (HOSPITAL_COMMUNITY): Payer: Self-pay | Admitting: *Deleted

## 2015-10-03 ENCOUNTER — Ambulatory Visit (HOSPITAL_COMMUNITY)
Admission: RE | Admit: 2015-10-03 | Discharge: 2015-10-03 | Disposition: A | Discharge status: Home / Self Care | Payer: BLUE CROSS/BLUE SHIELD | Source: Ambulatory Visit | Attending: Internal Medicine | Admitting: Internal Medicine

## 2015-10-03 DIAGNOSIS — D649 Anemia, unspecified: Secondary | ICD-10-CM | POA: Insufficient documentation

## 2015-10-03 LAB — PREPARE RBC (CROSSMATCH)

## 2015-10-04 ENCOUNTER — Encounter (HOSPITAL_COMMUNITY)
Admission: RE | Admit: 2015-10-04 | Discharge: 2015-10-04 | Disposition: A | Discharge status: Home / Self Care | Payer: BLUE CROSS/BLUE SHIELD | Source: Ambulatory Visit | Attending: Internal Medicine | Admitting: Internal Medicine

## 2015-10-04 DIAGNOSIS — D649 Anemia, unspecified: Secondary | ICD-10-CM | POA: Insufficient documentation

## 2015-10-04 LAB — CBC
HEMATOCRIT: 28.8 % — AB (ref 39.0–52.0)
HEMOGLOBIN: 8.2 g/dL — AB (ref 13.0–17.0)
MCH: 21.2 pg — ABNORMAL LOW (ref 26.0–34.0)
MCHC: 28.5 g/dL — ABNORMAL LOW (ref 30.0–36.0)
MCV: 74.6 fL — ABNORMAL LOW (ref 78.0–100.0)
Platelets: 386 10*3/uL (ref 150–400)
RBC: 3.86 MIL/uL — AB (ref 4.22–5.81)
RDW: 20.5 % — AB (ref 11.5–15.5)
WBC: 7.9 10*3/uL (ref 4.0–10.5)

## 2015-10-04 MED ORDER — FUROSEMIDE 10 MG/ML IJ SOLN
INTRAMUSCULAR | Status: AC
  Filled 2015-10-04: qty 4

## 2015-10-04 MED ORDER — FUROSEMIDE 10 MG/ML IJ SOLN
40.0000 mg | Freq: Once | INTRAMUSCULAR | Status: AC
  Administered 2015-10-04: 40 mg via INTRAVENOUS

## 2015-10-04 MED ORDER — SODIUM CHLORIDE 0.9 % IV SOLN
Freq: Once | INTRAVENOUS | Status: AC
  Administered 2015-10-04: 09:00:00 via INTRAVENOUS

## 2015-10-05 LAB — TYPE AND SCREEN
ABO/RH(D): A POS
ANTIBODY SCREEN: NEGATIVE
UNIT DIVISION: 0
UNIT DIVISION: 0

## 2015-10-16 ENCOUNTER — Other Ambulatory Visit (HOSPITAL_COMMUNITY): Payer: Self-pay | Admitting: *Deleted

## 2015-10-17 ENCOUNTER — Encounter (HOSPITAL_COMMUNITY)
Admission: RE | Admit: 2015-10-17 | Discharge: 2015-10-17 | Disposition: A | Discharge status: Home / Self Care | Payer: BLUE CROSS/BLUE SHIELD | Source: Ambulatory Visit | Attending: Internal Medicine | Admitting: Internal Medicine

## 2015-10-17 DIAGNOSIS — D649 Anemia, unspecified: Secondary | ICD-10-CM | POA: Diagnosis not present

## 2015-10-17 MED ORDER — SODIUM CHLORIDE 0.9 % IV SOLN
510.0000 mg | Freq: Once | INTRAVENOUS | Status: AC
  Administered 2015-10-17: 510 mg via INTRAVENOUS
  Filled 2015-10-17: qty 17

## 2016-06-06 DIAGNOSIS — R05 Cough: Secondary | ICD-10-CM | POA: Diagnosis not present

## 2016-10-15 DIAGNOSIS — Z0000 Encounter for general adult medical examination without abnormal findings: Secondary | ICD-10-CM | POA: Diagnosis not present

## 2016-10-15 DIAGNOSIS — Z125 Encounter for screening for malignant neoplasm of prostate: Secondary | ICD-10-CM | POA: Diagnosis not present

## 2016-10-15 DIAGNOSIS — E784 Other hyperlipidemia: Secondary | ICD-10-CM | POA: Diagnosis not present

## 2016-10-22 DIAGNOSIS — Z0000 Encounter for general adult medical examination without abnormal findings: Secondary | ICD-10-CM | POA: Diagnosis not present

## 2016-10-22 DIAGNOSIS — Z8719 Personal history of other diseases of the digestive system: Secondary | ICD-10-CM | POA: Diagnosis not present

## 2016-10-22 DIAGNOSIS — J3089 Other allergic rhinitis: Secondary | ICD-10-CM | POA: Diagnosis not present

## 2016-10-22 DIAGNOSIS — Z125 Encounter for screening for malignant neoplasm of prostate: Secondary | ICD-10-CM | POA: Diagnosis not present

## 2016-10-22 DIAGNOSIS — Z23 Encounter for immunization: Secondary | ICD-10-CM | POA: Diagnosis not present

## 2016-10-22 DIAGNOSIS — Z1389 Encounter for screening for other disorder: Secondary | ICD-10-CM | POA: Diagnosis not present

## 2016-10-22 DIAGNOSIS — E784 Other hyperlipidemia: Secondary | ICD-10-CM | POA: Diagnosis not present

## 2016-10-22 DIAGNOSIS — Z862 Personal history of diseases of the blood and blood-forming organs and certain disorders involving the immune mechanism: Secondary | ICD-10-CM | POA: Diagnosis not present

## 2017-03-05 DIAGNOSIS — D2239 Melanocytic nevi of other parts of face: Secondary | ICD-10-CM | POA: Diagnosis not present

## 2017-03-05 DIAGNOSIS — L720 Epidermal cyst: Secondary | ICD-10-CM | POA: Diagnosis not present

## 2017-03-16 DIAGNOSIS — M5416 Radiculopathy, lumbar region: Secondary | ICD-10-CM | POA: Diagnosis not present

## 2017-03-16 DIAGNOSIS — Z6830 Body mass index (BMI) 30.0-30.9, adult: Secondary | ICD-10-CM | POA: Diagnosis not present

## 2017-04-07 ENCOUNTER — Other Ambulatory Visit: Payer: Self-pay | Admitting: Internal Medicine

## 2017-04-07 DIAGNOSIS — M5416 Radiculopathy, lumbar region: Secondary | ICD-10-CM

## 2017-04-12 ENCOUNTER — Ambulatory Visit
Admission: RE | Admit: 2017-04-12 | Discharge: 2017-04-12 | Disposition: A | Discharge status: Home / Self Care | Payer: BLUE CROSS/BLUE SHIELD | Source: Ambulatory Visit | Attending: Internal Medicine | Admitting: Internal Medicine

## 2017-04-12 DIAGNOSIS — M5416 Radiculopathy, lumbar region: Secondary | ICD-10-CM

## 2017-04-12 DIAGNOSIS — M48061 Spinal stenosis, lumbar region without neurogenic claudication: Secondary | ICD-10-CM | POA: Diagnosis not present

## 2017-05-04 DIAGNOSIS — M5127 Other intervertebral disc displacement, lumbosacral region: Secondary | ICD-10-CM | POA: Diagnosis not present

## 2017-05-04 DIAGNOSIS — Z6830 Body mass index (BMI) 30.0-30.9, adult: Secondary | ICD-10-CM | POA: Diagnosis not present

## 2017-05-04 DIAGNOSIS — M5416 Radiculopathy, lumbar region: Secondary | ICD-10-CM | POA: Diagnosis not present

## 2017-06-12 DIAGNOSIS — M5416 Radiculopathy, lumbar region: Secondary | ICD-10-CM | POA: Diagnosis not present

## 2017-06-12 DIAGNOSIS — Z6829 Body mass index (BMI) 29.0-29.9, adult: Secondary | ICD-10-CM | POA: Diagnosis not present

## 2017-06-12 DIAGNOSIS — M5127 Other intervertebral disc displacement, lumbosacral region: Secondary | ICD-10-CM | POA: Diagnosis not present

## 2017-06-29 DIAGNOSIS — M5116 Intervertebral disc disorders with radiculopathy, lumbar region: Secondary | ICD-10-CM | POA: Diagnosis not present

## 2017-06-29 DIAGNOSIS — M5126 Other intervertebral disc displacement, lumbar region: Secondary | ICD-10-CM | POA: Diagnosis not present

## 2017-10-26 DIAGNOSIS — E7849 Other hyperlipidemia: Secondary | ICD-10-CM | POA: Diagnosis not present

## 2017-10-26 DIAGNOSIS — Z125 Encounter for screening for malignant neoplasm of prostate: Secondary | ICD-10-CM | POA: Diagnosis not present

## 2017-10-27 ENCOUNTER — Ambulatory Visit: Payer: Self-pay | Admitting: Medical

## 2017-10-27 ENCOUNTER — Encounter: Payer: Self-pay | Admitting: Medical

## 2017-10-27 VITALS — BP 134/72 | HR 68 | Temp 96.8°F | Wt 217.0 lb

## 2017-10-27 DIAGNOSIS — J0100 Acute maxillary sinusitis, unspecified: Secondary | ICD-10-CM

## 2017-10-27 MED ORDER — AMOXICILLIN-POT CLAVULANATE 875-125 MG PO TABS: 1.0000 | ORAL_TABLET | Freq: Two times a day (BID) | ORAL | 0 refills | Status: DC

## 2017-10-27 NOTE — Progress Notes (Signed)
   Subjective:    Patient ID: Chris [name], male    DOB: 05/08/1971, 46 y.o.   MRN: [id number]  HPI 46 yo male in non acute distress,  Started 5 days , runny nose, yellow and green and sometimes clear, some pnd. Cough, facial pressure , no fever or chills. Took decongestant OTC this morning 7am.   Blood pressure 134/72, pulse 68, temperature (!) 96.8 F (36 C), temperature source Tympanic, weight 217 lb (98.4 kg), SpO2 99 %.  Review of Systems  Constitutional: Negative for chills, fatigue and fever.  HENT: Positive for congestion, postnasal drip, rhinorrhea, sinus pain and sinus pressure. Negative for ear pain, sneezing, sore throat and voice change.   Eyes: Negative for discharge and itching.  Respiratory: Positive for cough. Negative for shortness of breath and wheezing.   Cardiovascular: Negative for chest pain.  Gastrointestinal: Negative for abdominal pain.  Genitourinary: Negative for dysuria.  Musculoskeletal: Negative for joint swelling and myalgias.  Skin: Negative for rash.  Allergic/Immunologic: Positive for environmental allergies. Negative for food allergies.  Neurological: Negative for dizziness, syncope and light-headedness.  Hematological: Negative for adenopathy.  Psychiatric/Behavioral: Negative for behavioral problems, self-injury, sleep disturbance and suicidal ideas. The patient is not nervous/anxious.        Objective:   Physical Exam  Constitutional: He is oriented to person, place, and time. He appears well-developed and well-nourished.  HENT:  Head: Normocephalic and atraumatic.  Right Ear: A middle ear effusion is present.  Left Ear: A middle ear effusion is present.  Nose: Mucosal edema and rhinorrhea present.  Mouth/Throat: Uvula is midline, oropharynx is clear and moist and mucous membranes are normal.  Eyes: Pupils are equal, round, and reactive to light. Conjunctivae and EOM are normal.  Neck: Normal range of motion. Neck supple.  Cardiovascular:  Normal rate, regular rhythm and normal heart sounds.  Exam reveals no gallop and no friction rub.   No murmur heard. Pulmonary/Chest: Effort normal and breath sounds normal.  Lymphadenopathy:    He has no cervical adenopathy.  Neurological: He is alert and oriented to person, place, and time.  Skin: Skin is warm and dry.  Psychiatric: He has a normal mood and affect. His behavior is normal. Judgment and thought content normal.  Nursing note and vitals reviewed.   Turbinate swelling and erythema on the left side in nare. Uvula mild swelling. Cough noted  in room    Assessment & Plan:  Sinusitis Meds ordered this encounter  Medications  . amoxicillin-clavulanate (AUGMENTIN) 875-125 MG tablet    Sig: Take 1 tablet by mouth 2 (two) times daily.    Dispense:  20 tablet    Refill:  0  Return in  3-5 days if not improving , rest and increase fluids. pateint verbalizes understanding and has no questions at discharge.

## 2017-10-27 NOTE — Patient Instructions (Addendum)
Sinusitis, Adult Sinusitis is soreness and inflammation of your sinuses. Sinuses are hollow spaces in the bones around your face. They are located:  Around your eyes.  In the middle of your forehead.  Behind your nose.  In your cheekbones.  Your sinuses and nasal passages are lined with a stringy fluid (mucus). Mucus normally drains out of your sinuses. When your nasal tissues get inflamed or swollen, the mucus can get trapped or blocked so air cannot flow through your sinuses. This lets bacteria, viruses, and funguses grow, and that leads to infection. Follow these instructions at home: Medicines  Take, use, or apply over-the-counter and prescription medicines only as told by your doctor. These may include nasal sprays.  If you were prescribed an antibiotic medicine, take it as told by your doctor. Do not stop taking the antibiotic even if you start to feel better. Hydrate and Humidify  Drink enough water to keep your pee (urine) clear or pale yellow.  Use a cool mist humidifier to keep the humidity level in your home above 50%.  Breathe in steam for 10-15 minutes, 3-4 times a day or as told by your doctor. You can do this in the bathroom while a hot shower is running.  Try not to spend time in cool or dry air. Rest  Rest as much as possible.  Sleep with your head raised (elevated).  Make sure to get enough sleep each night. General instructions  Put a warm, moist washcloth on your face 3-4 times a day or as told by your doctor. This will help with discomfort.  Wash your hands often with soap and water. If there is no soap and water, use hand sanitizer.  Do not smoke. Avoid being around people who are smoking (secondhand smoke).  Keep all follow-up visits as told by your doctor. This is important. Contact a doctor if:  You have a fever.  Your symptoms get worse.  Your symptoms do not get better within 10 days. Get help right away if:  You have a very bad  headache.  You cannot stop throwing up (vomiting).  You have pain or swelling around your face or eyes.  You have trouble seeing.  You feel confused.  Your neck is stiff.  You have trouble breathing. This information is not intended to replace advice given to you by your health care provider. Make sure you discuss any questions you have with your health care provider. Document Released: 06/02/2008 Document Revised: 08/10/2016 Document Reviewed: 10/10/2015 Elsevier Interactive Patient Education  2018 [employer]. Amoxicillin; Clavulanic Acid tablets What is this medicine? AMOXICILLIN; CLAVULANIC ACID (a mox i SIL in; KLAV yoo lan ic AS id) is a penicillin antibiotic. It is used to treat certain kinds of bacterial infections. It will not work for colds, flu, or other viral infections. This medicine may be used for other purposes; ask your health care provider or pharmacist if you have questions. COMMON BRAND NAME(S): Augmentin What should I tell my health care provider before I take this medicine? They need to know if you have any of these conditions: -bowel disease, like colitis -kidney disease -liver disease -mononucleosis -an unusual or allergic reaction to amoxicillin, penicillin, cephalosporin, other antibiotics, clavulanic acid, other medicines, foods, dyes, or preservatives -pregnant or trying to get pregnant -breast-feeding How should I use this medicine? Take this medicine by mouth with a full glass of water. Follow the directions on the prescription label. Take at the start of a meal. Do not crush  or chew. If the tablet has a score line, you may cut it in half at the score line for easier swallowing. Take your medicine at regular intervals. Do not take your medicine more often than directed. Take all of your medicine as directed even if you think you are better. Do not skip doses or stop your medicine early. Talk to your pediatrician regarding the use of this medicine in  children. Special care may be needed. Overdosage: If you think you have taken too much of this medicine contact a poison control center or emergency room at once. NOTE: This medicine is only for you. Do not share this medicine with others. What if I miss a dose? If you miss a dose, take it as soon as you can. If it is almost time for your next dose, take only that dose. Do not take double or extra doses. What may interact with this medicine? -allopurinol -anticoagulants -birth control pills -methotrexate -probenecid This list may not describe all possible interactions. Give your health care provider a list of all the medicines, herbs, non-prescription drugs, or dietary supplements you use. Also tell them if you smoke, drink alcohol, or use illegal drugs. Some items may interact with your medicine. What should I watch for while using this medicine? Tell your doctor or health care professional if your symptoms do not improve. Do not treat diarrhea with over the counter products. Contact your doctor if you have diarrhea that lasts more than 2 days or if it is severe and watery. If you have diabetes, you may get a false-positive result for sugar in your urine. Check with your doctor or health care professional. Birth control pills may not work properly while you are taking this medicine. Talk to your doctor about using an extra method of birth control. What side effects may I notice from receiving this medicine? Side effects that you should report to your doctor or health care professional as soon as possible: -allergic reactions like skin rash, itching or hives, swelling of the face, lips, or tongue -breathing problems -dark urine -fever or chills, sore throat -redness, blistering, peeling or loosening of the skin, including inside the mouth -seizures -trouble passing urine or change in the amount of urine -unusual bleeding, bruising -unusually weak or tired -white patches or sores in the  mouth or throat Side effects that usually do not require medical attention (report to your doctor or health care professional if they continue or are bothersome): -diarrhea -dizziness -headache -nausea, vomiting -stomach upset -vaginal or anal irritation This list may not describe all possible side effects. Call your doctor for medical advice about side effects. You may report side effects to FDA at 1-800-FDA-1088. Where should I keep my medicine? Keep out of the reach of children. Store at room temperature below 25 degrees C (77 degrees F). Keep container tightly closed. Throw away any unused medicine after the expiration date. NOTE: This sheet is a summary. It may not cover all possible information. If you have questions about this medicine, talk to your doctor, pharmacist, or health care provider.  2018 Elsevier/Gold Standard (2008-03-09 12:04:30)

## 2017-10-29 DIAGNOSIS — Z0000 Encounter for general adult medical examination without abnormal findings: Secondary | ICD-10-CM | POA: Diagnosis not present

## 2017-10-29 DIAGNOSIS — Z862 Personal history of diseases of the blood and blood-forming organs and certain disorders involving the immune mechanism: Secondary | ICD-10-CM | POA: Diagnosis not present

## 2017-10-29 DIAGNOSIS — J3089 Other allergic rhinitis: Secondary | ICD-10-CM | POA: Diagnosis not present

## 2017-10-29 DIAGNOSIS — E7849 Other hyperlipidemia: Secondary | ICD-10-CM | POA: Diagnosis not present

## 2017-10-29 DIAGNOSIS — Z23 Encounter for immunization: Secondary | ICD-10-CM | POA: Diagnosis not present

## 2017-10-29 DIAGNOSIS — M5416 Radiculopathy, lumbar region: Secondary | ICD-10-CM | POA: Diagnosis not present

## 2017-10-29 DIAGNOSIS — Z1389 Encounter for screening for other disorder: Secondary | ICD-10-CM | POA: Diagnosis not present

## 2018-03-09 DIAGNOSIS — K641 Second degree hemorrhoids: Secondary | ICD-10-CM | POA: Diagnosis not present

## 2018-03-12 DIAGNOSIS — D6489 Other specified anemias: Secondary | ICD-10-CM | POA: Diagnosis not present

## 2018-03-23 ENCOUNTER — Ambulatory Visit (HOSPITAL_COMMUNITY)
Admission: RE | Admit: 2018-03-23 | Discharge: 2018-03-23 | Disposition: A | Discharge status: Home / Self Care | Payer: BLUE CROSS/BLUE SHIELD | Source: Ambulatory Visit | Attending: Internal Medicine | Admitting: Internal Medicine

## 2018-03-23 DIAGNOSIS — D649 Anemia, unspecified: Secondary | ICD-10-CM | POA: Insufficient documentation

## 2018-03-23 MED ORDER — SODIUM CHLORIDE 0.9 % IV SOLN
510.0000 mg | Freq: Once | INTRAVENOUS | Status: DC
  Administered 2018-03-23: 510 mg via INTRAVENOUS
  Filled 2018-03-23: qty 17

## 2018-03-29 ENCOUNTER — Encounter (HOSPITAL_COMMUNITY): Payer: BLUE CROSS/BLUE SHIELD

## 2018-03-30 ENCOUNTER — Ambulatory Visit (HOSPITAL_COMMUNITY)
Admission: RE | Admit: 2018-03-30 | Discharge: 2018-03-30 | Disposition: A | Discharge status: Home / Self Care | Payer: BLUE CROSS/BLUE SHIELD | Source: Ambulatory Visit | Attending: Internal Medicine | Admitting: Internal Medicine

## 2018-03-30 DIAGNOSIS — D649 Anemia, unspecified: Secondary | ICD-10-CM | POA: Insufficient documentation

## 2018-03-30 MED ORDER — SODIUM CHLORIDE 0.9 % IV SOLN
510.0000 mg | Freq: Once | INTRAVENOUS | Status: AC
  Administered 2018-03-30: 510 mg via INTRAVENOUS
  Filled 2018-03-30: qty 17

## 2018-04-20 ENCOUNTER — Encounter: Payer: Self-pay | Admitting: Medical

## 2018-04-20 ENCOUNTER — Ambulatory Visit: Payer: Self-pay | Admitting: Medical

## 2018-04-20 VITALS — BP 126/76 | HR 84 | Temp 99.7°F | Resp 16 | Ht 70.0 in | Wt 203.4 lb

## 2018-04-20 DIAGNOSIS — R059 Cough, unspecified: Secondary | ICD-10-CM

## 2018-04-20 DIAGNOSIS — R05 Cough: Secondary | ICD-10-CM

## 2018-04-20 DIAGNOSIS — J40 Bronchitis, not specified as acute or chronic: Secondary | ICD-10-CM

## 2018-04-20 MED ORDER — AMOXICILLIN-POT CLAVULANATE 875-125 MG PO TABS: 1.0000 | ORAL_TABLET | Freq: Two times a day (BID) | ORAL | 0 refills | Status: DC

## 2018-04-20 MED ORDER — BENZONATATE 100 MG PO CAPS: ORAL_CAPSULE | ORAL | 0 refills | Status: DC

## 2018-04-20 NOTE — Patient Instructions (Addendum)
Acute Bronchitis, Adult Acute bronchitis is when air tubes (bronchi) in the lungs suddenly get swollen. The condition can make it hard to breathe. It can also cause these symptoms:  A cough.  Coughing up clear, yellow, or green mucus.  Wheezing.  Chest congestion.  Shortness of breath.  A fever.  Body aches.  Chills.  A sore throat.  Follow these instructions at home: Medicines  Take over-the-counter and prescription medicines only as told by your doctor.  If you were prescribed an antibiotic medicine, take it as told by your doctor. Do not stop taking the antibiotic even if you start to feel better. General instructions  Rest.  Drink enough fluids to keep your pee (urine) clear or pale yellow.  Avoid smoking and secondhand smoke. If you smoke and you need help quitting, ask your doctor. Quitting will help your lungs heal faster. Use an inhaler, cool mist vaporizer, or humidifier as tol Cough, Adult A cough helps to clear your throat and lungs. A cough may last only 2-3 weeks (acute), or it may last longer than 8 weeks (chronic). Many different things can cause a cough. A cough may be a sign of an illness or another medical condition. Follow these instructions at home: Pay attention to any changes in your cough. Take medicines only as told by your doctor. If you were prescribed an antibiotic medicine, take it as told by your doctor. Do not stop taking it even if you start to feel better. Talk with your doctor before you try using a cough medicine. Drink enough fluid to keep your pee (urine) clear or pale yellow. If the air is dry, use a cold steam vaporizer or humidifier in your home. Stay away from things that make you cough at work or at home. If your cough is worse at night, try using extra pillows to raise your head up higher while you sleep. Do not smoke, and try not to be around smoke. If you need help quitting, ask your doctor. Do not have caffeine. Do not drink  alcohol. Rest as needed. Contact a doctor if: You have new problems (symptoms). You cough up yellow fluid (pus). Your cough does not get better after 2-3 weeks, or your cough gets worse. Medicine does not help your cough and you are not sleeping well. You have pain that gets worse or pain that is not helped with medicine. You have a fever. You are losing weight and you do not know why. You have night sweats. Get help right away if: You cough up blood. You have trouble breathing. Your heartbeat is very fast. This information is not intended to replace advice given to you by your health care provider. Make sure you discuss any questions you have with your health care provider. Document Released: 08/28/2011 Document Revised: 05/22/2016 Document Reviewed: 02/21/2015 Elsevier Interactive Patient Education  2018 [employer].  d by your doctor.  Keep all follow-up visits as told by your doctor. This is important. How is this prevented? To lower your risk of getting this condition again:  Wash your hands often with soap and water. If you cannot use soap and water, use hand sanitizer.  Avoid contact with people who have cold symptoms.  Try not to touch your hands to your mouth, nose, or eyes.  Make sure to get the flu shot every year.  Contact a doctor if:  Your symptoms do not get better in 2 weeks. Get help right away if:  You cough up blood.  You have chest pain.  You have very bad shortness of breath.  You become dehydrated.  You faint (pass out) or keep feeling like you are going to pass out.  You keep throwing up (vomiting).  You have a very bad headache.  Your fever or chills gets worse. This information is not intended to replace advice given to you by your health care provider. Make sure you discuss any questions you have with your health care provider. Document Released: 06/02/2008 Document Revised: 07/23/2016 Document Reviewed: 06/04/2016 Elsevier  Interactive Patient Education  [employer].

## 2018-04-20 NOTE — Progress Notes (Addendum)
Subjective:    Patient ID: Chris [name], male    DOB: 08-18-1971, 47 y.o.   MRN: [id number]  HPI 47 yo male in non acute distress. Started with symptoms last Thursday  (4 days ago).  Cough, non productive, no wheezing , Fever and chills this morning. Took Mucinex , cough syrup with antipyretic Acetaminophen. Denies Shortness of breath, denies chest pain except with coughing.  Throat hurts with coughing. Takes Nasonex and Allegra daily year round.  Blood pressure 126/76, pulse 84, temperature 99.7 F (37.6 C), temperature source Tympanic, resp. rate 16, height 5\' 10"  (1.778 m), weight 203 lb 6.4 oz (92.3 kg), SpO2 97 %. Review of Systems  Constitutional: Negative for chills and fever.  HENT: Positive for congestion, postnasal drip, rhinorrhea (clear with small amount of yellow), sore throat (with coughing) and voice change. Negative for ear discharge, ear pain, tinnitus and trouble swallowing.   Eyes: Negative for discharge and itching.  Respiratory: Positive for cough. Negative for chest tightness, shortness of breath and wheezing.   Cardiovascular: Positive for chest pain (with coughing).  Gastrointestinal: Positive for abdominal pain (soreness from coughing).  Endocrine: Negative for cold intolerance and heat intolerance.  Genitourinary: Negative for dysuria.  Musculoskeletal: Negative for myalgias.  Skin: Negative for rash.  Allergic/Immunologic: Positive for environmental allergies. Negative for food allergies and immunocompromised state.  Neurological: Positive for headaches. Negative for dizziness, syncope and light-headedness.  Hematological: Negative for adenopathy.  Psychiatric/Behavioral: Negative for behavioral problems, self-injury and suicidal ideas. The patient is not nervous/anxious.        Objective:   Physical Exam  Constitutional: He is oriented to person, place, and time. He appears well-developed and well-nourished.  HENT:  Head: Normocephalic and atraumatic.   Right Ear: Hearing, external ear and ear canal normal.  Left Ear: Hearing, external ear and ear canal normal. A middle ear effusion is present.  Nose: Nose normal.  Mouth/Throat: Mucous membranes are normal. Uvula swelling present. No posterior oropharyngeal edema or posterior oropharyngeal erythema.  Eyes: Pupils are equal, round, and reactive to light. Conjunctivae and EOM are normal.  Neck: Normal range of motion. Neck supple.  Cardiovascular: Normal rate, regular rhythm, normal heart sounds and intact distal pulses.  Pulmonary/Chest: Effort normal. He has wheezes (right upper mild expiratory).  Neurological: He is alert and oriented to person, place, and time.  Skin: Skin is warm and dry.  Psychiatric: He has a normal mood and affect. His behavior is normal. Judgment and thought content normal.  Nursing note and vitals reviewed.   Cerumen in right ear obscuring TM Mild expiratory wheeze on the right upper lobe. Cough noted  in room    Assessment & Plan:  Bronchitis/cough Meds ordered this encounter  Medications  . amoxicillin-clavulanate (AUGMENTIN) 875-125 MG tablet    Sig: Take 1 tablet by mouth 2 (two) times daily.    Dispense:  20 tablet    Refill:  0  . benzonatate (TESSALON PERLES) 100 MG capsule    Sig: 1-2 capsules every 8 hours as needed for cough    Dispense:  30 capsule    Refill:  0   Declines breathing treatment.or Albuterol  MDI. Patient to try to get in 2 doses of antibiotics  Today if possible..  To take OTC Motrin orTylenol take as directed as needed for fever.  If cough medication not working to call the office. If high fever to call the office. Return to the clinic in 2-4 days if not improving. Reviewed pneumonia  symptoms with patient and to return to the clinic if worsening. He verbalizes understanding and has no questions at discharge.  Patient called 04/28/2018 asking for refill of  Benzonatate due to continuing to cough.  Refill sent to pharmacy 11:15  am . Same sig. #30 no refills.

## 2018-04-28 MED ORDER — BENZONATATE 100 MG PO CAPS: ORAL_CAPSULE | ORAL | 0 refills | Status: DC

## 2018-04-28 NOTE — Addendum Note (Signed)
Addended by: Elsie Sakuma, Nira Conn R on: 04/28/2018 11:16 AM   Modules accepted: Orders

## 2018-07-17 DIAGNOSIS — H109 Unspecified conjunctivitis: Secondary | ICD-10-CM | POA: Diagnosis not present

## 2018-07-22 ENCOUNTER — Ambulatory Visit: Payer: Self-pay | Admitting: Adult Health

## 2018-07-22 ENCOUNTER — Encounter: Payer: Self-pay | Admitting: Adult Health

## 2018-07-22 VITALS — BP 150/76 | HR 80 | Temp 98.3°F | Resp 16 | Wt 207.2 lb

## 2018-07-22 DIAGNOSIS — H109 Unspecified conjunctivitis: Secondary | ICD-10-CM

## 2018-07-22 DIAGNOSIS — H66002 Acute suppurative otitis media without spontaneous rupture of ear drum, left ear: Secondary | ICD-10-CM

## 2018-07-22 MED ORDER — AMOXICILLIN-POT CLAVULANATE 875-125 MG PO TABS: 1.0000 | ORAL_TABLET | Freq: Two times a day (BID) | ORAL | 0 refills | Status: DC

## 2018-07-22 MED ORDER — CIPROFLOXACIN HCL 0.3 % OP SOLN: 2.0000 [drp] | OPHTHALMIC | 0 refills | Status: AC

## 2018-07-22 NOTE — Patient Instructions (Signed)
Otitis Media, Adult Otitis media is redness, soreness, and puffiness (swelling) in the space just behind your eardrum (middle ear). It may be caused by allergies or infection. It often happens along with a cold. Follow these instructions at home:  Take your medicine as told. Finish it even if you start to feel better.  Only take over-the-counter or prescription medicines for pain, discomfort, or fever as told by your doctor.  Follow up with your doctor as told. Contact a doctor if:  You have otitis media only in one ear, or bleeding from your nose, or both.  You notice a lump on your neck.  You are not getting better in 3-5 days.  You feel worse instead of better. Get help right away if:  You have pain that is not helped with medicine.  You have puffiness, redness, or pain around your ear.  You get a stiff neck.  You cannot move part of your face (paralysis).  You notice that the bone behind your ear hurts when you touch it. This information is not intended to replace advice given to you by your health care provider. Make sure you discuss any questions you have with your health care provider. Document Released: 06/02/2008 Document Revised: 05/22/2016 Document Reviewed: 07/12/2013 Elsevier Interactive Patient Education  2017 [place]. Bacterial Conjunctivitis Bacterial conjunctivitis is an infection of your conjunctiva. This is the clear membrane that covers the white part of your eye and the inner surface of your eyelid. This condition can make your eye:  Red or pink.  Itchy.  This condition is caused by bacteria. This condition spreads very easily from person to person (is contagious) and from one eye to the other eye. Follow these instructions at home: Medicines  Take or apply your antibiotic medicine as told by your doctor. Do not stop taking or applying the antibiotic even if you start to feel better.  Take or apply over-the-counter and prescription medicines  only as told by your doctor.  Do not touch your eyelid with the eye drop bottle or the ointment tube. Managing discomfort  Wipe any fluid from your eye with a warm, wet washcloth or a cotton ball.  Place a cool, clean washcloth on your eye. Do this for 10-20 minutes, 3-4 times per day. General instructions  Do not wear contact lenses until the irritation is gone. Wear glasses until your doctor says it is okay to wear contacts.  Do not wear eye makeup until your symptoms are gone. Throw away any old makeup.  Change or wash your pillowcase every day.  Do not share towels or washcloths with anyone.  Wash your hands often with soap and water. Use paper towels to dry your hands.  Do not touch or rub your eyes.  Do not drive or use heavy machinery if your vision is blurry. Contact a doctor if:  You have a fever.  Your symptoms do not get better after 10 days. Get help right away if:  You have a fever and your symptoms suddenly get worse.  You have very bad pain when you move your eye.  Your face: ? Hurts. ? Is red. ? Is swollen.  You have sudden loss of vision. This information is not intended to replace advice given to you by your health care provider. Make sure you discuss any questions you have with your health care provider. Document Released: 09/23/2008 Document Revised: 05/22/2016 Document Reviewed: 09/27/2015 Elsevier Interactive Patient Education  [employer].

## 2018-07-22 NOTE — Progress Notes (Addendum)
Subjective:     Patient ID: Chris [name], male   DOB: 07/10/1971, 47 y.o.   MRN: [id number]  HPI   Blood pressure (!) 150/76, pulse 80, temperature 98.3 F (36.8 C), temperature source Tympanic, resp. rate 16, weight 207 lb 3.2 oz (94 kg), SpO2 98 %.  Patient is a 47 year old male with a diagnosis of conjunctivitis on Saturday July 20 th - he has been using Tobramycin eye drop from [place] [facility] as prescribed.  He reports his eye lashes are not sticking together any longer kicked they were on Saturday though he has yellow discharge and crust on his eye lashes upon awakening. Eye was swollen more on saturday per patient. He had more spreading redness below eye that is resolving.   He has been dabbing eye with kleenex. Mild photophobia while outside.  Patient states " it has definitely  Improved" since starting drops Saturday.   Denies any trauma to eye. Denies any injury. Denies any chemosis.  He reports [place]. Onset of symptoms 1 week ago.  Denies any eye pain or vision change.    No Known Allergies  Review of Systems  Constitutional: Negative.   HENT: Positive for sinus pressure (forehead - left ). Negative for congestion, dental problem, drooling, ear discharge, ear pain, facial swelling, hearing loss, mouth sores, nosebleeds, postnasal drip, rhinorrhea, sinus pain, sneezing, sore throat, tinnitus, trouble swallowing and voice change.   Eyes: Positive for photophobia, discharge (yellow on eyelashes / worse in the morning. ) and redness. Negative for pain, itching and visual disturbance.  Respiratory: Negative.   Cardiovascular: Negative.   Gastrointestinal: Negative.   Genitourinary: Negative.   Musculoskeletal: Negative.   Skin: Negative.   Neurological: Negative.   Hematological: Negative.   Psychiatric/Behavioral: Negative.        Objective:   Physical Exam  Constitutional: He is oriented to person, place, and time. He appears  well-developed and well-nourished. He is active.  Non-toxic appearance. He does not have a sickly appearance. He does not appear ill. No distress.  Patient is alert and oriented and responsive to questions Engages in eye contact with provider. Speaks in full sentences without any pauses without any shortness of breath or distress.    HENT:  Head: Normocephalic and atraumatic.    Right Ear: Hearing, external ear and ear canal normal. Tympanic membrane is not perforated and not erythematous. A middle ear effusion is present.  Left Ear: Hearing, external ear and ear canal normal. Tympanic membrane is erythematous. Tympanic membrane is not perforated. A middle ear effusion (fluid tan behind tympanic membrane - ( membrane dull in apperance)) is present.  Nose: Nose normal.  Mouth/Throat: Oropharynx is clear and moist. No oropharyngeal exudate.  Scattered pink areas or irritation - closed skin below left eye periorbital area likely due to rubbing with cloth and tissue  Eyes: Pupils are equal, round, and reactive to light. EOM and lids are normal. Lids are everted and swept, no foreign bodies found. Right eye exhibits no chemosis, no discharge, no exudate and no hordeolum. No foreign body present in the right eye. Left eye exhibits discharge (watery eye ) and exudate (yellow exudate attached near uooer eyelid lashes noticed with eyelid roll with cotton swab.). Left eye exhibits no chemosis and no hordeolum. No foreign body present in the left eye. Right conjunctiva is not injected. Right conjunctiva has no hemorrhage. Left conjunctiva is injected (pink conjunctiva as indicated on drawing / diagram).  Left conjunctiva has no hemorrhage. No scleral icterus. Left eye exhibits normal extraocular motion and no nystagmus.  Fundoscopic exam:      The left eye shows no AV nicking, no hemorrhage and no papilledema. The left eye shows no red reflex.  Slit lamp exam:      The right eye shows no corneal abrasion.        The left eye shows no corneal abrasion, no corneal flare, no corneal ulcer, no foreign body, no hyphema, no hypopyon and no fluorescein uptake.    Neck: Normal range of motion. Neck supple. No JVD present. No tracheal deviation present.  Cardiovascular: Normal rate, regular rhythm, normal heart sounds and intact distal pulses. Exam reveals no gallop and no friction rub.  No murmur heard. Pulmonary/Chest: Effort normal and breath sounds normal. No stridor. No respiratory distress. He has no wheezes. He has no rales. He exhibits no tenderness.  Abdominal: Soft. Bowel sounds are normal.  Musculoskeletal: Normal range of motion.  Lymphadenopathy:    He has no cervical adenopathy.  Neurological: He is alert and oriented to person, place, and time. He displays normal reflexes. No cranial nerve deficit. He exhibits normal muscle tone. Coordination normal.  Skin: Skin is warm and dry. No rash noted. He is not diaphoretic. No erythema. No pallor.  Psychiatric: He has a normal mood and affect. His behavior is normal. Judgment and thought content normal.  Vitals reviewed.      Assessment:       Bacterial conjunctivitis of left eye  Non-recurrent acute suppurative otitis media of left ear without spontaneous rupture of tympanic membrane   Plan:      Meds ordered this encounter  Medications  . ciprofloxacin (CILOXAN) 0.3 % ophthalmic solution    Sig: Place 2 drops into the left eye every 4 (four) hours while awake for 7 days.    Dispense:  3.5 mL    Refill:  0  . amoxicillin-clavulanate (AUGMENTIN) 875-125 MG tablet    Sig: Take 1 tablet by mouth 2 (two) times daily.    Dispense:  20 tablet    Refill:  0      Tobramycin drops discontinued.   Avoid rubbing skin around eyes and avoid rubbing eyes.  Take medication as directed.  Follow up recheck advised  on 07/23/18- he is unsure if he can return on this day and reports he is going out of town on Saturday 07/24/18. Advised to be seen while  out of town if any symptoms worsen or change at any time or any spreading redness, pain in his eye or face, vision changes he should be seen right away- advised symptoms should not worsen. He will return to office 07/26/18   Advised patient call the office or your primary care doctor for an appointment if no improvement within 72 hours or if any symptoms change or worsen at any time  Advised ER or urgent Care if after hours or on weekend. Call 911 for emergency symptoms at any time.Patinet verbalized understanding of all instructions given/reviewed and treatment plan and has no further questions or concerns at this time.    Patient verbalized understanding of all instructions given and denies any further questions at this time.   Provider thoroughly discussed in collaboration above plan with supervising physician Dr. Miguel Aschoff who is in agreement with the care plan as above.

## 2018-07-26 DIAGNOSIS — D6489 Other specified anemias: Secondary | ICD-10-CM | POA: Diagnosis not present

## 2018-09-09 DIAGNOSIS — D509 Iron deficiency anemia, unspecified: Secondary | ICD-10-CM | POA: Diagnosis not present

## 2018-09-13 ENCOUNTER — Ambulatory Visit: Payer: Self-pay | Admitting: General Surgery

## 2018-09-13 DIAGNOSIS — K641 Second degree hemorrhoids: Secondary | ICD-10-CM | POA: Diagnosis not present

## 2018-09-13 NOTE — H&P (Signed)
History of Present Illness Chris Ruff MD; 3/66/4403 3:44 PM) The patient is a 47 year old male who presents with a complaint of Rectal bleeding. 47 year old male who presents to the office with recurrent rectal bleeding. He underwent an extensive workup at [facility] approximately 5 years ago. Banding at [facility] was performed. He was noted to have a hemoglobin of 5.6. He was transfused 2 units and responded appropriately. He was seen by Dr. Carlean Purl who recommended evaluation here in the office after colonoscopy was neg. He was seen in the office after another episode of bleeding in 2016. We performed a rubber band ligation. This resolved his symptoms until 10 months ago. He reports severe intermittent bleeding with BM's. He denies any constipation and has been off his fiber supplement for about 2 weeks.   Problem List/Past Medical Chris Ruff, MD; 4/74/2595 3:45 PM) PROLAPSED INTERNAL HEMORRHOIDS, GRADE 2 (K64.1)  Past Surgical History Chris Ruff, MD; 6/38/7564 3:45 PM) No pertinent past surgical history  Diagnostic Studies History Chris Ruff, MD; 3/32/9518 3:45 PM) Colonoscopy 1-5 years ago  Allergies [name]; 09/13/2018 2:41 PM) No Known Drug Allergies [10/01/2015]: Allergies Reconciled  Medication History [name]; 09/13/2018 2:41 PM) Ferrous Fumarate (100MG  Tablet Chewable, Oral) Active. Nasonex (50MCG/ACT Suspension, Nasal) Active. Allegra (180MG  Tablet, Oral) Active. Poly-Iron 150 (150MG  Capsule, Oral) Active. Medications Reconciled  Social History Chris Ruff, MD; 8/41/6606 3:45 PM) Caffeine use Carbonated beverages. No alcohol use No drug use Tobacco use Never smoker.  Family History Chris Ruff, MD; 02/26/6009 3:45 PM) Arthritis Mother. Diabetes Mellitus Father. Heart Disease Father. Hypertension Mother. Melanoma Father.  Other Problems Chris Ruff, MD; 9/32/3557 3:45 PM) Back  Pain Hemorrhoids Other disease, cancer, significant illness     Review of Systems Chris Ruff MD; 03/19/253 3:45 PM) General Not Present- Appetite Loss, Chills, Fatigue, Fever, Night Sweats, Weight Gain and Weight Loss. Skin Not Present- Change in Wart/Mole, Dryness, Hives, Jaundice, New Lesions, Non-Healing Wounds, Rash and Ulcer. HEENT Present- Seasonal Allergies. Not Present- Earache, Hearing Loss, Hoarseness, Nose Bleed, Oral Ulcers, Ringing in the Ears, Sinus Pain, Sore Throat, Visual Disturbances, Wears glasses/contact lenses and Yellow Eyes. Respiratory Not Present- Bloody sputum, Chronic Cough, Difficulty Breathing, Snoring and Wheezing. Breast Not Present- Breast Mass, Breast Pain, Nipple Discharge and Skin Changes. Cardiovascular Not Present- Chest Pain, Difficulty Breathing Lying Down, Leg Cramps, Palpitations, Rapid Heart Rate, Shortness of Breath and Swelling of Extremities. Gastrointestinal Present- Bloody Stool and Hemorrhoids. Not Present- Abdominal Pain, Bloating, Change in Bowel Habits, Chronic diarrhea, Constipation, Difficulty Swallowing, Excessive gas, Gets full quickly at meals, Indigestion, Nausea, Rectal Pain and Vomiting. Musculoskeletal Not Present- Back Pain, Joint Pain, Joint Stiffness, Muscle Pain, Muscle Weakness and Swelling of Extremities. Neurological Not Present- Decreased Memory, Fainting, Headaches, Numbness, Seizures, Tingling, Tremor, Trouble walking and Weakness. Psychiatric Not Present- Anxiety, Bipolar, Change in Sleep Pattern, Depression, Fearful and Frequent crying. Endocrine Not Present- Cold Intolerance, Excessive Hunger, Hair Changes, Heat Intolerance and New Diabetes. Hematology Not Present- Easy Bruising, Excessive bleeding, Gland problems, HIV and Persistent Infections.  Vitals [name]; 09/13/2018 2:41 PM) 09/13/2018 2:41 PM Weight: 200 lb Height: 70in Body Surface Area: 2.09 m Body Mass Index: 28.7 kg/m  Pain Level:  0/10 Temp.: 25F(Temporal)  Pulse: 81 (Regular)  P.OX: 99% (Room air) BP: 128/60 (Sitting, Left Arm, Standard)      Physical Exam Chris Ruff MD; 2/70/6237 3:46 PM)  General Mental Status-Alert. General Appearance-Not in acute distress. Build & Nutrition-Well nourished. Posture-Normal posture. Gait-Normal.  Head and Neck Head-normocephalic,  atraumatic with no lesions or palpable masses. Trachea-midline.  Chest and Lung Exam Chest and lung exam reveals -on auscultation, normal breath sounds, no adventitious sounds and normal vocal resonance.  Cardiovascular Cardiovascular examination reveals -normal heart sounds, regular rate and rhythm with no murmurs and no digital clubbing, cyanosis, edema, increased warmth or tenderness.  Abdomen Inspection Inspection of the abdomen reveals - No Hernias. Palpation/Percussion Palpation and Percussion of the abdomen reveal - Soft, Non Tender, No Rigidity (guarding), No hepatosplenomegaly and No Palpable abdominal masses.  Rectal Anorectal Exam External - skin tag. Internal - increased sphincter tone. Proctoscopic exam -Note:Bleeding right posterior internal hemorrhoid. Rubber band ligation was attempted but the rubber band would not stay on the hemorrhoid tissue. Trauma to the hemorrhoid was cauterized with silver nitrate until the bleeding stopped.   Neurologic Neurologic evaluation reveals -alert and oriented x 3 with no impairment of recent or remote memory, normal attention span and ability to concentrate, normal sensation and normal coordination.  Musculoskeletal Normal Exam - Bilateral-Upper Extremity Strength Normal and Lower Extremity Strength Normal.    Assessment & Plan Chris Ruff MD; 1/44/8185 3:27 PM)  PROLAPSED INTERNAL HEMORRHOIDS, GRADE 2 (K64.1) Impression: He continues to have massive rectal bleeding. We attempted a rubber band ligation today but this was unsuccessful to do  the broad nature of the hemorrhoid base in the right posterior position. I have recommended transhemorrhoidal dearterialization. We have discussed that has a 95% success rate with rectal bleeding.  Common risks include bleeding, recurrence, pain and urinary retention.

## 2018-09-15 ENCOUNTER — Encounter (HOSPITAL_COMMUNITY): Payer: Self-pay | Admitting: *Deleted

## 2018-09-16 ENCOUNTER — Other Ambulatory Visit (HOSPITAL_COMMUNITY): Payer: Self-pay

## 2018-09-17 ENCOUNTER — Ambulatory Visit (HOSPITAL_COMMUNITY)
Admission: RE | Admit: 2018-09-17 | Discharge: 2018-09-17 | Disposition: A | Discharge status: Home / Self Care | Payer: BLUE CROSS/BLUE SHIELD | Source: Ambulatory Visit | Attending: Internal Medicine | Admitting: Internal Medicine

## 2018-09-17 DIAGNOSIS — D649 Anemia, unspecified: Secondary | ICD-10-CM | POA: Diagnosis not present

## 2018-09-17 MED ORDER — SODIUM CHLORIDE 0.9 % IV SOLN
510.0000 mg | INTRAVENOUS | Status: DC
  Administered 2018-09-17: 510 mg via INTRAVENOUS
  Filled 2018-09-17: qty 17

## 2018-09-24 ENCOUNTER — Ambulatory Visit (HOSPITAL_COMMUNITY)
Admission: RE | Admit: 2018-09-24 | Discharge: 2018-09-24 | Disposition: A | Discharge status: Home / Self Care | Payer: BLUE CROSS/BLUE SHIELD | Source: Ambulatory Visit | Attending: Internal Medicine | Admitting: Internal Medicine

## 2018-09-24 DIAGNOSIS — D649 Anemia, unspecified: Secondary | ICD-10-CM | POA: Insufficient documentation

## 2018-09-24 MED ORDER — SODIUM CHLORIDE 0.9 % IV SOLN
510.0000 mg | INTRAVENOUS | Status: DC
  Administered 2018-09-24: 510 mg via INTRAVENOUS
  Filled 2018-09-24: qty 17

## 2018-10-11 NOTE — Patient Instructions (Signed)
Chris [name]  10/11/2018   Your procedure is scheduled on: Friday 10/15/2018  Report to [facility] Main  Entrance              Report to admitting at  0530  AM    Call this number if you have problems the morning of surgery [phone]    Remember: Do not eat food or drink liquids :After Midnight.              BRUSH YOUR TEETH MORNING OF SURGERY AND RINSE YOUR MOUTH OUT, NO CHEWING GUM CANDY OR MINTS.     Take these medicines the morning of surgery with A SIP OF WATER: Fexofenadine (Allegra), use Mometasone (Nasonex) nasal spray                                 You may not have any metal on your body including hair pins and              piercings  Do not wear jewelry, make-up, lotions, powders or perfumes, deodorant             Men may shave face and neck.   Do not bring valuables to the hospital. [place].  Contacts, dentures or bridgework may not be worn into surgery.  Leave suitcase in the car. After surgery it may be brought to your room.     Patients discharged the day of surgery will not be allowed to drive home.  Name and phone number of your driver:                Please read over the following fact sheets you were given: _____________________________________________________________________             [facility] - Preparing for Surgery Before surgery, you can play an important role.  Because skin is not sterile, your skin needs to be as free of germs as possible.  You can reduce the number of germs on your skin by washing with CHG (chlorahexidine gluconate) soap before surgery.  CHG is an antiseptic cleaner which kills germs and bonds with the skin to continue killing germs even after washing. Please DO NOT use if you have an allergy to CHG or antibacterial soaps.  If your skin becomes reddened/irritated stop using the CHG and inform your nurse when you arrive at Short Stay. Do not shave  (including legs and underarms) for at least 48 hours prior to the first CHG shower.  You may shave your face/neck. Please follow these instructions carefully:  1.  Shower with CHG Soap the night before surgery and the  morning of Surgery.  2.  If you choose to wash your hair, wash your hair first as usual with your  normal  shampoo.  3.  After you shampoo, rinse your hair and body thoroughly to remove the  shampoo.                           4.  Use CHG as you would any other liquid soap.  You can apply chg directly  to the skin and wash  Gently with a scrungie or clean washcloth.  5.  Apply the CHG Soap to your body ONLY FROM THE NECK DOWN.   Do not use on face/ open                           Wound or open sores. Avoid contact with eyes, ears mouth and genitals (private parts).                       Wash face,  Genitals (private parts) with your normal soap.             6.  Wash thoroughly, paying special attention to the area where your surgery  will be performed.  7.  Thoroughly rinse your body with warm water from the neck down.  8.  DO NOT shower/wash with your normal soap after using and rinsing off  the CHG Soap.                9.  Pat yourself dry with a clean towel.            10.  Wear clean pajamas.            11.  Place clean sheets on your bed the night of your first shower and do not  sleep with pets. Day of Surgery : Do not apply any lotions/deodorants the morning of surgery.  Please wear clean clothes to the hospital/surgery center.  FAILURE TO FOLLOW THESE INSTRUCTIONS MAY RESULT IN THE CANCELLATION OF YOUR SURGERY PATIENT SIGNATURE_________________________________  NURSE SIGNATURE__________________________________  ________________________________________________________________________

## 2018-10-11 NOTE — Progress Notes (Signed)
09/09/2018- on chart labs from Dr. Brigitte Pulse- CBC, Ferritin, Iron and TIBC  07/26/2018- noted on chart-labs from Dr. Brigitte Pulse- CBC

## 2018-10-14 ENCOUNTER — Inpatient Hospital Stay (HOSPITAL_COMMUNITY)
Admission: RE | Admit: 2018-10-14 | Discharge: 2018-10-14 | Disposition: A | Discharge status: Home / Self Care | Payer: BLUE CROSS/BLUE SHIELD | Source: Ambulatory Visit

## 2018-10-15 ENCOUNTER — Ambulatory Visit (HOSPITAL_COMMUNITY): Admit: 2018-10-15 | Payer: BLUE CROSS/BLUE SHIELD | Admitting: General Surgery

## 2018-10-15 ENCOUNTER — Encounter (HOSPITAL_COMMUNITY): Payer: Self-pay

## 2018-10-15 SURGERY — TRANSANAL HEMORRHOIDAL DEARTERIALIZATION
Anesthesia: General

## 2018-11-03 DIAGNOSIS — E7849 Other hyperlipidemia: Secondary | ICD-10-CM | POA: Diagnosis not present

## 2018-11-03 DIAGNOSIS — Z125 Encounter for screening for malignant neoplasm of prostate: Secondary | ICD-10-CM | POA: Diagnosis not present

## 2018-11-03 DIAGNOSIS — Z0000 Encounter for general adult medical examination without abnormal findings: Secondary | ICD-10-CM | POA: Diagnosis not present

## 2018-11-10 DIAGNOSIS — D508 Other iron deficiency anemias: Secondary | ICD-10-CM | POA: Diagnosis not present

## 2018-11-10 DIAGNOSIS — Z0000 Encounter for general adult medical examination without abnormal findings: Secondary | ICD-10-CM | POA: Diagnosis not present

## 2018-11-10 DIAGNOSIS — Z1389 Encounter for screening for other disorder: Secondary | ICD-10-CM | POA: Diagnosis not present

## 2018-11-10 DIAGNOSIS — K648 Other hemorrhoids: Secondary | ICD-10-CM | POA: Diagnosis not present

## 2018-11-10 DIAGNOSIS — J3089 Other allergic rhinitis: Secondary | ICD-10-CM | POA: Diagnosis not present

## 2018-11-10 DIAGNOSIS — E7849 Other hyperlipidemia: Secondary | ICD-10-CM | POA: Diagnosis not present

## 2018-11-19 DIAGNOSIS — Z3009 Encounter for other general counseling and advice on contraception: Secondary | ICD-10-CM | POA: Diagnosis not present

## 2018-12-03 DIAGNOSIS — Z302 Encounter for sterilization: Secondary | ICD-10-CM | POA: Diagnosis not present

## 2019-02-21 ENCOUNTER — Ambulatory Visit: Payer: Self-pay | Admitting: Medical

## 2019-02-21 ENCOUNTER — Encounter: Payer: Self-pay | Admitting: Medical

## 2019-02-21 VITALS — BP 135/70 | HR 60 | Temp 98.6°F | Resp 16 | Wt 213.4 lb

## 2019-02-21 DIAGNOSIS — J0100 Acute maxillary sinusitis, unspecified: Secondary | ICD-10-CM

## 2019-02-21 MED ORDER — AMOXICILLIN-POT CLAVULANATE 875-125 MG PO TABS: 1.0000 | ORAL_TABLET | Freq: Two times a day (BID) | ORAL | 0 refills | Status: DC

## 2019-02-21 NOTE — Progress Notes (Signed)
Subjective:    Patient ID: Chris [name], male    DOB: [birth year], 48 y.o.   MRN: [id number]  HPI 48 yo male in non actue distress. Started with symptoms last Tuesday with nasal congestion green and yellow discharge. . Denies fever or chills Chest pain or shortness of breath.  Blood pressure 135/70, pulse 60, temperature 98.6 F (37 C), temperature source Tympanic, resp. rate 16, weight 213 lb 6.4 oz (96.8 kg), SpO2 100 %. No Known Allergies  Review of Systems  Constitutional: Negative for chills and fever.  HENT: Positive for congestion, postnasal drip, rhinorrhea, sinus pressure and sinus pain (forehead and maxillary). Negative for ear pain and sore throat.   Eyes: Negative for discharge and itching.  Respiratory: Positive for cough. Negative for shortness of breath and wheezing.   Cardiovascular: Negative for chest pain.  Gastrointestinal: Negative for abdominal pain, diarrhea, nausea and vomiting.  Genitourinary: Negative for dysuria.  Musculoskeletal: Negative for myalgias.  Skin: Negative for rash.  Neurological: Negative for dizziness, syncope and light-headedness.  Hematological: Negative for adenopathy.  Psychiatric/Behavioral: Negative for behavioral problems, self-injury and suicidal ideas.       Objective:   Physical Exam Vitals signs and nursing note reviewed.  Constitutional:      Appearance: Normal appearance. He is normal weight.  HENT:     Head: Normocephalic and atraumatic.     Jaw: There is normal jaw occlusion.     Right Ear: Ear canal and external ear normal. A middle ear effusion is present.     Left Ear: Ear canal and external ear normal. A middle ear effusion is present.     Nose: Congestion and rhinorrhea present.     Left Turbinates: Swollen.     Mouth/Throat:     Lips: Pink.     Mouth: Mucous membranes are moist.     Pharynx: Oropharynx is clear.  Eyes:     Extraocular Movements: Extraocular movements intact.     Conjunctiva/sclera:  Conjunctivae normal.     Pupils: Pupils are equal, round, and reactive to light.  Neck:     Musculoskeletal: Normal range of motion and neck supple.  Cardiovascular:     Rate and Rhythm: Normal rate and regular rhythm.  Pulmonary:     Effort: Pulmonary effort is normal. No respiratory distress.     Breath sounds: Normal breath sounds. No stridor. No wheezing, rhonchi or rales.  Musculoskeletal: Normal range of motion.  Lymphadenopathy:     Cervical: No cervical adenopathy.  Skin:    General: Skin is warm and dry.  Neurological:     General: No focal deficit present.     Mental Status: He is alert and oriented to person, place, and time.  Psychiatric:        Mood and Affect: Mood normal.        Behavior: Behavior normal.        Thought Content: Thought content normal.        Judgment: Judgment normal.     Cough noted in room.      Assessment & Plan:  Sinusitis maxilllary Meds ordered this encounter  Medications  . amoxicillin-clavulanate (AUGMENTIN) 875-125 MG tablet    Sig: Take 1 tablet by mouth 2 (two) times daily.    Dispense:  20 tablet    Refill:  0  Rest, increase fluids, OTC Motrin or Tylenol for fever or pain, take as directed on package. Return in 3-5 days if not improving. Patient verbalizes understanding and has  no questions at discharge.

## 2019-04-12 ENCOUNTER — Telehealth: Payer: Self-pay | Admitting: Plastic Surgery

## 2019-04-12 NOTE — Telephone Encounter (Signed)
Called patient to confirm appointment scheduled for tomorrow. Patient answered the following questions: °1.Has the patient traveled outside of the state of [place] at all within the past 6 weeks? No °2.Does the patient have a fever or cough at all? No °3.Has the patient been tested for COVID? Had a positive COVID test? No °4. Has the patient been in contact with anyone who has tested positive? No ° °

## 2019-04-13 ENCOUNTER — Other Ambulatory Visit: Payer: Self-pay

## 2019-04-13 ENCOUNTER — Ambulatory Visit: Payer: BLUE CROSS/BLUE SHIELD | Admitting: Plastic Surgery

## 2019-04-13 ENCOUNTER — Encounter: Payer: Self-pay | Admitting: Plastic Surgery

## 2019-04-13 VITALS — BP 114/71 | HR 60 | Temp 98.3°F | Ht 70.0 in | Wt 208.0 lb

## 2019-04-13 DIAGNOSIS — L723 Sebaceous cyst: Secondary | ICD-10-CM

## 2019-04-13 DIAGNOSIS — L989 Disorder of the skin and subcutaneous tissue, unspecified: Secondary | ICD-10-CM | POA: Diagnosis not present

## 2019-04-13 NOTE — Progress Notes (Signed)
     Patient ID: Chris [name], male    DOB: 1971-08-14, 48 y.o.   MRN: [id number]   Chief Complaint  Patient presents with  . Skin Problem    The patient is a 48 year old white male here for evaluation of several lesions.  He states a strong family history of skin cancer.  He is not sure what type it was.  He has a nonpigmented changing skin lesion on his left cheek.  It has a slight pit in the center and is 6 mm in size.  He is concerned because it is getting larger.  There is a cyst on his back that continues to come and go.  Recently it appeared to be infected and drained.  That is 1.5 cm in size.  He is otherwise in good health and does not have any recent illnesses or infections.  Nothing seems to improve the areas.  He also has a small hemangioma on his chin.   Review of Systems  Constitutional: Negative.   HENT: Negative.   Eyes: Negative.   Respiratory: Negative.   Cardiovascular: Negative.   Gastrointestinal: Negative.   Endocrine: Negative.   Genitourinary: Negative.   Musculoskeletal: Negative.   Hematological: Negative.   Psychiatric/Behavioral: Negative.     Past Medical History:  Diagnosis Date  . Allergic rhinitis   . Chronic back pain   . Gastrointestinal bleed 2013   secondary to hemorrhoids  . Hyperlipidemia    borderline  . Internal hemorrhoids   . Iron deficiency anemia   . Transfusion history 2013, 2016    Past Surgical History:  Procedure Laterality Date  . capsule endoscopy    . COLONOSCOPY    . ESOPHAGOGASTRODUODENOSCOPY    . HEMORRHOID BANDING  2013   Done at [facility]      Current Outpatient Medications:  .  fexofenadine (ALLEGRA) 180 MG tablet, Take 180 mg by mouth daily., Disp: , Rfl:  .  mometasone (NASONEX) 50 MCG/ACT nasal spray, Place 2 sprays into the nose daily., Disp: , Rfl:  .  POLY-IRON 150 150 MG capsule, Take 150 mg by mouth daily. , Disp: , Rfl: 4   Objective:   Vitals:   04/13/19 0840  BP: 114/71  Pulse: 60  Temp:  98.3 F (36.8 C)  SpO2: 98%    Physical Exam Vitals signs and nursing note reviewed.  Constitutional:      Appearance: Normal appearance.  HENT:     Head: Normocephalic and atraumatic.  Cardiovascular:     Rate and Rhythm: Normal rate.     Pulses: Normal pulses.  Skin:    General: Skin is warm.  Neurological:     General: No focal deficit present.     Mental Status: He is alert and oriented to person, place, and time.  Psychiatric:        Mood and Affect: Mood normal.        Behavior: Behavior normal.     Assessment & Plan:  Changing skin lesion  Sebaceous cyst   Recommend excision of changing skin lesion of left cheek.  Excision of sebaceous cyst of back.  Laser to hemangioma of chin.  I discussed that there will be a scar and patient is okay with this. Pictures taken with patient permission and placed in chart   [place], DO

## 2019-04-28 ENCOUNTER — Telehealth: Payer: Self-pay | Admitting: Plastic Surgery

## 2019-04-28 NOTE — Telephone Encounter (Signed)
Called patient to confirm appointment scheduled for tomorrow. Patient answered the following questions: °1.Has the patient traveled outside of the state of [place] at all within the past 6 weeks? No °2.Does the patient have a fever or cough at all? No °3.Has the patient been tested for COVID? Had a positive COVID test? No °4. Has the patient been in contact with anyone who has tested positive? No ° °

## 2019-04-29 ENCOUNTER — Other Ambulatory Visit: Payer: Self-pay

## 2019-04-29 ENCOUNTER — Encounter: Payer: Self-pay | Admitting: Plastic Surgery

## 2019-04-29 ENCOUNTER — Ambulatory Visit: Payer: BLUE CROSS/BLUE SHIELD | Admitting: Plastic Surgery

## 2019-04-29 ENCOUNTER — Other Ambulatory Visit (HOSPITAL_COMMUNITY)
Admission: RE | Admit: 2019-04-29 | Discharge: 2019-04-29 | Disposition: A | Discharge status: Home / Self Care | Payer: BLUE CROSS/BLUE SHIELD | Source: Ambulatory Visit | Attending: Plastic Surgery | Admitting: Plastic Surgery

## 2019-04-29 VITALS — BP 138/88 | HR 64 | Temp 98.1°F | Ht 71.0 in | Wt 209.0 lb

## 2019-04-29 DIAGNOSIS — L723 Sebaceous cyst: Secondary | ICD-10-CM | POA: Diagnosis not present

## 2019-04-29 DIAGNOSIS — L989 Disorder of the skin and subcutaneous tissue, unspecified: Secondary | ICD-10-CM

## 2019-04-29 DIAGNOSIS — D2239 Melanocytic nevi of other parts of face: Secondary | ICD-10-CM | POA: Diagnosis not present

## 2019-04-29 DIAGNOSIS — L708 Other acne: Secondary | ICD-10-CM | POA: Diagnosis not present

## 2019-04-29 NOTE — Addendum Note (Signed)
Addended by: Wallace Going on: 04/29/2019 02:11 PM   Modules accepted: Orders

## 2019-04-29 NOTE — Progress Notes (Signed)
Preoperative Dx:  1. Sebaceous cyst 2. Changing skin lesion of forehead 3. Changing skin lesion of left cheek  Postoperative Dx: Same  Procedure:  1. Excision of sebaceous cyst of back 2 cm 2. Excision of changing skin lesion forehead 5 mm 3. Excision of changing skin lesion cheek 1 cm  Surgeon: Dr. Lyndee Leo Malasha Kleppe  Anesthesia: Lidocaine 1% with 1:100,000 epinepherine  Indication for Procedure: skin lesions  Description of Procedure: Risks and complications were explained to the patient.  Consent was confirmed and signed.  Time out was called and all information was confirmed to be correct.  The area was prepped with betadine and drapped.  Lidocaine 1% with epinepherine was injected in the subcutaneous area.    Forehead: After waiting several minutes for the lidocaine to take affect a #15 blade was used to excise the 5 mm area in an eliptical pattern.  The skin edges were reapproximated with 5-0 Monocryl subcuticular running closure and a single interrupted stitch.  Steri strips were applied.  The specimen was went to pathology.  Left cheek:  After waiting several minutes for the lidocaine to take affect a #15 blade was used to excise the 1 cm area in an eliptical pattern.  A 5-0 Monocryl was used to close the deep layers with simple interrupted stitches.  The skin edges were reapproximated with 5-0 Monocryl subcuticular running closure.  Steri strips were applied.  The specimen was sent to pathology.  Back: After waiting several minutes for the lidocaine to take affect a #15 blade was used to incise the skin over the lesion.  The #15 blade and scissors were used to excise the entire 2 cm lesion that had the consistency of a sebaceous cyst. A 5-0 Monocryl was used to close the deep layers with simple interrupted stitches.  The skin edges were reapproximated with 5-0 Monocryl vertical mattress suture. A dressing was applied.  The patient is to follow up in one week.  He tolerated the  procedure well and there were no complications.

## 2019-04-29 NOTE — Addendum Note (Signed)
Addended by: Wallace Going on: 04/29/2019 02:00 PM   Modules accepted: Orders

## 2019-05-05 ENCOUNTER — Telehealth: Payer: Self-pay | Admitting: Plastic Surgery

## 2019-05-05 NOTE — Telephone Encounter (Signed)
Called patient to confirm appointment scheduled for tomorrow. Patient answered the following questions: °1.Has the patient traveled outside of the state of [place] at all within the past 6 weeks? No °2.Does the patient have a fever or cough at all? No °3.Has the patient been tested for COVID? Had a positive COVID test? No °4. Has the patient been in contact with anyone who has tested positive? No ° °

## 2019-05-06 ENCOUNTER — Other Ambulatory Visit: Payer: Self-pay

## 2019-05-06 ENCOUNTER — Encounter: Payer: Self-pay | Admitting: Plastic Surgery

## 2019-05-06 ENCOUNTER — Ambulatory Visit (INDEPENDENT_AMBULATORY_CARE_PROVIDER_SITE_OTHER): Payer: BLUE CROSS/BLUE SHIELD | Admitting: Plastic Surgery

## 2019-05-06 DIAGNOSIS — D2239 Melanocytic nevi of other parts of face: Secondary | ICD-10-CM

## 2019-05-06 DIAGNOSIS — D229 Melanocytic nevi, unspecified: Secondary | ICD-10-CM | POA: Insufficient documentation

## 2019-05-06 MED ORDER — CEPHALEXIN 500 MG PO CAPS: 500.0000 mg | ORAL_CAPSULE | Freq: Four times a day (QID) | ORAL | 0 refills | Status: AC

## 2019-05-06 NOTE — Progress Notes (Signed)
The patient underwent excision of forehead and left cheek lesion.  Path showed a melanocytic nevus for both.  They are healing well.  No sign of infection.  Sutures removed today and steri strip applied.  The back is irritated.  Will send in antibiotic.  Follow up in 1 week for suture removal of back cyst.

## 2019-05-13 ENCOUNTER — Ambulatory Visit: Payer: BLUE CROSS/BLUE SHIELD | Admitting: Plastic Surgery

## 2019-05-16 ENCOUNTER — Telehealth: Payer: Self-pay | Admitting: Plastic Surgery

## 2019-05-16 NOTE — Telephone Encounter (Signed)

## 2019-05-17 ENCOUNTER — Ambulatory Visit: Payer: BLUE CROSS/BLUE SHIELD | Admitting: Plastic Surgery

## 2019-05-17 ENCOUNTER — Encounter: Payer: Self-pay | Admitting: Plastic Surgery

## 2019-05-17 ENCOUNTER — Other Ambulatory Visit: Payer: Self-pay

## 2019-05-17 VITALS — BP 140/87 | HR 63 | Temp 97.9°F | Ht 71.0 in | Wt 208.0 lb

## 2019-05-17 DIAGNOSIS — L723 Sebaceous cyst: Secondary | ICD-10-CM

## 2019-05-17 NOTE — Progress Notes (Signed)
The patient is a 48 year old male here for follow-up on his back.  He had an  Excision of a sebaceous cyst.  The area was a little bit red on his last visit.  I gave him a few days of antibiotics which seems to have helped.  Today it looks clean and healthy.  No sign of infection.  Sutures removed.  He can shower like usual.  He had 3 small hemangioma looking lesions on his face and these were lasered with the NdYag.  Follow-up as needed.

## 2019-06-02 ENCOUNTER — Telehealth: Payer: Self-pay | Admitting: Plastic Surgery

## 2019-06-02 NOTE — Telephone Encounter (Signed)
Called and [facility] @ 11:18am @ [phone]) asking the patient to RTC regarding the message below.//AB/CMA

## 2019-06-02 NOTE — Telephone Encounter (Signed)
Spoke with the patient regarding the message below.  He stated it's a hard area under the edge of the scar on his face.  Asked if there is any pain and he stated no pain.  He asked if he could just come in and let Dr. Marla Roe just look at it and see if there is anything going on.  Informed the patient that Dr. Marla Roe is not in the office today, but ask soon as I can speak with her I will give him a call back.  He stated that he can come in anytime tomorrow or M, Tues, W of next week.  Patient verbalized understanding and agreed.//AB/CMA

## 2019-06-02 NOTE — Telephone Encounter (Signed)
Received call from patient stating he has noticed a hard area after having his sutures removed. He would like a call back at [phone].

## 2019-06-03 ENCOUNTER — Other Ambulatory Visit: Payer: Self-pay

## 2019-06-03 ENCOUNTER — Encounter: Payer: Self-pay | Admitting: Plastic Surgery

## 2019-06-03 ENCOUNTER — Ambulatory Visit: Payer: BC Managed Care – PPO | Admitting: Plastic Surgery

## 2019-06-03 VITALS — BP 121/70 | HR 67 | Temp 98.8°F | Ht 70.0 in | Wt 212.8 lb

## 2019-06-03 DIAGNOSIS — L989 Disorder of the skin and subcutaneous tissue, unspecified: Secondary | ICD-10-CM

## 2019-06-03 NOTE — Telephone Encounter (Signed)
Called and spoke with the patient regarding the message below.   Asked the patient if he could come in today at 1:30pm.  Patient stated that he could.  Patient will be seen today.//AB/CMA

## 2019-06-03 NOTE — Progress Notes (Signed)
The patient is a 48 year old male here for follow-up.  He noticed a little bit of hardening underneath where the lesion was removed.  There is also an area next to it that is elevated.  This is concerning to him.  It is healing well there is a little bit of scar tissue which is normal.  I would like him to start massaging it.  And due to the fact that [place] is [place] out of [place] we will do a revision on Monday.  Pictures taken and placed in the chart with the patient permission.

## 2019-06-06 ENCOUNTER — Other Ambulatory Visit: Payer: Self-pay

## 2019-06-06 ENCOUNTER — Encounter: Payer: Self-pay | Admitting: Plastic Surgery

## 2019-06-06 ENCOUNTER — Ambulatory Visit: Payer: BC Managed Care – PPO | Admitting: Plastic Surgery

## 2019-06-06 VITALS — BP 121/76 | HR 65 | Temp 98.4°F | Ht 70.0 in | Wt 211.4 lb

## 2019-06-06 DIAGNOSIS — L989 Disorder of the skin and subcutaneous tissue, unspecified: Secondary | ICD-10-CM

## 2019-06-06 NOTE — Progress Notes (Signed)
Preoperative Dx: left cheek lesion  Postoperative Dx: Same  Procedure: excision of left cheek lesion  Surgeon: Dr. Lyndee Leo Dillingham  Anesthesia: Lidocaine 1% with 1:100,000 epinepherine  Indication for Procedure: skin lesion  Description of Procedure: Risks and complications were explained to the patient.  Consent was confirmed.  Time out was called and all information was confirmed to be correct.  The area was prepped with betadine and drapped.  Lidocaine 1% with epinepherine was injected in the subcutaneous area.  After waiting several minutes for the lidocaine to take affect a #15 blade was used to excise the area in an eliptical pattern.  A 6-0 Monocryl was used to close the deep layers with simple interrupted stitches.  The skin edges were reapproximated with 6-0 Monocryl subcuticular running closure.  Steri strips were applied.  The patient is to follow up in one week.  He tolerated the procedure well and there were no complications.

## 2019-06-20 ENCOUNTER — Telehealth: Payer: Self-pay | Admitting: Plastic Surgery

## 2019-06-20 NOTE — Telephone Encounter (Signed)

## 2019-06-21 ENCOUNTER — Other Ambulatory Visit: Payer: Self-pay

## 2019-06-21 ENCOUNTER — Ambulatory Visit (INDEPENDENT_AMBULATORY_CARE_PROVIDER_SITE_OTHER): Payer: BC Managed Care – PPO | Admitting: Surgical

## 2019-06-21 ENCOUNTER — Encounter: Payer: Self-pay | Admitting: Surgical

## 2019-06-21 VITALS — BP 116/76 | HR 60 | Temp 98.4°F | Ht 70.0 in | Wt 209.0 lb

## 2019-06-21 DIAGNOSIS — D2239 Melanocytic nevi of other parts of face: Secondary | ICD-10-CM

## 2019-06-21 NOTE — Progress Notes (Signed)
Established Patient Office Visit  Subjective:  Patient ID: Chris [name], male    DOB: [birth year]  Age: 48 y.o. MRN: [id number]  CC: No chief complaint on file.   HPI Chris [name] is a 48 yo male presenting for f/u after left cheek skin lesion excision that was performed on 06/06/19. Prior to this excision, patient had a previous excision at the same location, which was re-excised due to patient concern for hardening underneath previous lesion removal. Pathology showed melanocytic nevus and dilated pore of winer with free margins. Incision healed well and sutures were removed today. Minimal scabbing was present and steri-strips were applied. No evidence of infection.  Past Medical History:  Diagnosis Date  . Allergic rhinitis   . Chronic back pain   . Gastrointestinal bleed 2013   secondary to hemorrhoids  . Hyperlipidemia    borderline  . Internal hemorrhoids   . Iron deficiency anemia   . Transfusion history 2013, 2016    Past Surgical History:  Procedure Laterality Date  . capsule endoscopy    . COLONOSCOPY    . ESOPHAGOGASTRODUODENOSCOPY    . HEMORRHOID BANDING  2013   Done at [facility]    Family History  Problem Relation Age of Onset  . Diabetes Father        Type 2  . Coronary artery disease Father        s/p CABG  . Hypertension Mother   . Alzheimer's disease Maternal Grandmother   . Hypertension Maternal Grandmother   . Arthritis Maternal Grandmother   . Heart disease Paternal Grandfather     Social History   Socioeconomic History  . Marital status: Married    Spouse name: Not on file  . Number of children: 1  . Years of education: Not on file  . Highest education level: Not on file  Occupational History  . Occupation: Insurance account manager  Social Needs  . Financial resource strain: Not on file  . Food insecurity    Worry: Not on file    Inability: Not on file  . Transportation needs    Medical: Not on file    Non-medical: Not on file   Tobacco Use  . Smoking status: Never Smoker  . Smokeless tobacco: Never Used  Substance and Sexual Activity  . Alcohol use: No    Alcohol/week: 0.0 standard drinks  . Drug use: No  . Sexual activity: Not on file  Lifestyle  . Physical activity    Days per week: Not on file    Minutes per session: Not on file  . Stress: Not on file  Relationships  . Social Herbalist on phone: Not on file    Gets together: Not on file    Attends religious service: Not on file    Active member of club or organization: Not on file    Attends meetings of clubs or organizations: Not on file    Relationship status: Not on file  . Intimate partner violence    Fear of current or ex partner: Not on file    Emotionally abused: Not on file    Physically abused: Not on file    Forced sexual activity: Not on file  Other Topics Concern  . Not on file  Social History Narrative   Single with one daughter.   Employed at your on [place]   2 caffeinated beverages a day   08/29/2015    Outpatient Medications Prior to Visit  Medication  Sig Dispense Refill  . fexofenadine (ALLEGRA) 180 MG tablet Take 180 mg by mouth daily.    . mometasone (NASONEX) 50 MCG/ACT nasal spray Place 2 sprays into the nose daily.    Marland Kitchen POLY-IRON 150 150 MG capsule Take 150 mg by mouth daily.   4   No facility-administered medications prior to visit.     No Known Allergies  ROS Review of Systems  Constitutional: Negative.   HENT: Negative.  Negative for facial swelling.   Eyes: Negative.   Respiratory: Negative.   Skin: Negative for pallor and rash.  Neurological: Negative.       Objective:    Physical Exam  Constitutional: He is oriented to person, place, and time. He appears well-developed and well-nourished. No distress.  HENT:  Head: Normocephalic and atraumatic.  Eyes: Pupils are equal, round, and reactive to light. EOM are normal.  Neck: Normal range of motion.  Musculoskeletal: Normal  range of motion.  Neurological: He is alert and oriented to person, place, and time.  Skin: Skin is warm and dry. No rash noted. He is not diaphoretic.  Minimal erythema with Mild adhesion on L cheek.      BP 116/76 (BP Location: Left Arm, Patient Position: Sitting, Cuff Size: Normal)   Pulse 60   Temp 98.4 F (36.9 C) (Oral)   Ht 5\' 10"  (1.778 m)   Wt 209 lb (94.8 kg)   SpO2 98%   BMI 29.99 kg/m  Wt Readings from Last 3 Encounters:  06/21/19 209 lb (94.8 kg)  06/06/19 211 lb 6.4 oz (95.9 kg)  06/03/19 212 lb 12.8 oz (96.5 kg)     Health Maintenance Due  Topic Date Due  . HIV Screening  06/15/1988  . TETANUS/TDAP  06/15/1992    There are no preventive care reminders to display for this patient.  No results found for: TSH Lab Results  Component Value Date   WBC 7.9 10/04/2015   HGB 8.2 (L) 10/04/2015   HCT 28.8 (L) 10/04/2015   MCV 74.6 (L) 10/04/2015   PLT 386 10/04/2015   Lab Results  Component Value Date   NA 142 03/31/2012   K 3.7 03/31/2012   CO2 31 03/31/2012   GLUCOSE 87 03/31/2012   BUN 11 03/31/2012   CREATININE 0.97 03/31/2012   BILITOT 0.8 03/13/2012   ALKPHOS 43 (L) 03/13/2012   AST 8 (L) 03/13/2012   ALT 15 03/13/2012   PROT 6.1 (L) 03/13/2012   ALBUMIN 3.4 03/13/2012   CALCIUM 9.0 03/31/2012   ANIONGAP 8 03/31/2012   No results found for: CHOL No results found for: HDL No results found for: LDLCALC No results found for: TRIG No results found for: CHOLHDL No results found for: HGBA1C    Assessment & Plan:     ICD-10-CM   1. Melanocytic nevus of face, other location  D22.39     Plan:  Patient instructed to leave steri-strips on for about 1 week or until they fall off.  Massage area with mederma once steri-strips are gone.  Continue to eat healthy to promote healing.  Instructed to return or call with questions or concerns.   Problem List Items Addressed This Visit      Other   Melanocytic nevus - Primary      No orders  of the defined types were placed in this encounter.   Follow-up: No follow-ups on file.    Carola Rhine Lakoda Mcanany, PA-C

## 2019-06-22 ENCOUNTER — Ambulatory Visit: Payer: Self-pay | Admitting: Surgery

## 2019-06-22 DIAGNOSIS — K641 Second degree hemorrhoids: Secondary | ICD-10-CM | POA: Diagnosis not present

## 2019-06-22 DIAGNOSIS — K644 Residual hemorrhoidal skin tags: Secondary | ICD-10-CM | POA: Diagnosis not present

## 2019-06-22 DIAGNOSIS — K642 Third degree hemorrhoids: Secondary | ICD-10-CM | POA: Diagnosis not present

## 2019-06-22 DIAGNOSIS — K648 Other hemorrhoids: Secondary | ICD-10-CM | POA: Diagnosis not present

## 2019-06-22 NOTE — H&P (Signed)
Chris [name] Documented: 06/22/2019 4:21 PM Location: [facility] Patient #: [id number] DOB: 1971/12/23 Married / Language: Chris [name] / Race: White Male  History of Present Illness Chris Hector MD; 06/22/2019 5:31 PM) The patient is a 48 year old male who presents with hemorrhoids. Note for "Hemorrhoids": ` ` ` Patient sent for surgical consultation at the request of Dr Brigitte Pulse  Chief Complaint: Recurrent rectal bleeding. Probable symptomatically hemorrhoids. ` ` The patient is a pleasant gentleman that has struggled with hemorrhoids in the past. He had severe rectal bleeding requiring admission and transfusion many years ago. Workup was underwhelming and sent for significant hemorrhoids. Underwent banding and had improvement. Had banding in 2013. He had a colonoscopy that was otherwise underwhelming. Had another episode of bleeding 2016. Rubber band ligation done. That seemed to help. Followed up with Dr. Marcello Moores again last year with recurrent bleeding hemorrhoids. She felt that the hemorrhoids were too enlarged and thickened to tolerate banding and recommended surgery last year. Hemorrhoidal ligation/pexy in the [facility] fashion. The patient added iron and fiber supplement and his bowels softened. His bleeding went away. He held off on surgery.  However, this week he began have an episode of significant rectal bleeding. He doesn't know exactly what triggered it. It concerns him. He wished seen as soon as possible in the hopes that he could be banded this time around. He normally moves his bowels once a day. He takes an iron supplement every day for concerns of his anemia. He's never had surgery, just banding at least 2 if not 3 times. He is relocating to [facility] soon. He was hoping to see if this could be dealt with before he moved. Therefore, we worked into my clinic since Dr. Marcello Moores was not available until next week  No personal nor family history of  GI/colon cancer, inflammatory bowel disease, irritable bowel syndrome, allergy such as Celiac Sprue, dietary/dairy problems, colitis, ulcers nor gastritis. No recent sick contacts/gastroenteritis. No travel outside the country. No changes in diet. No dysphagia to solids or liquids. No significant heartburn or reflux. No hematemesis, coffee ground emesis. No evidence of prior gastric/peptic ulceration.  (Review of systems as stated in this history (HPI) or in the review of systems. Otherwise all other 12 point ROS are negative) ` ` `   Problem List/Past Medical Chris Hector, MD; 06/22/2019 5:31 PM) PROLAPSED INTERNAL HEMORRHOIDS, GRADE 2 (K64.1) PROLAPSED INTERNAL HEMORRHOIDS, GRADE 3 (K64.2) EXTERNAL HEMORRHOIDS WITH COMPLICATION (O27.0) SCROTAL MASS (N50.89) ENCOUNTER FOR PREOPERATIVE EXAMINATION FOR GENERAL SURGICAL PROCEDURE (Z01.818) INTERNAL BLEEDING HEMORRHOIDS (K64.8) History of internal bleeding hemorrhoids requiring transfusions and iron supplementation. Repeated banding's with persistent recurrent bleeding  Medication History (Chris [name], CMA; 06/22/2019 4:26 PM) Poly-Iron 150 (150MG  Capsule, Oral) Active. Nasonex (50MCG/ACT Suspension, Nasal) Active. Allegra (180MG  Tablet, Oral) Active. Medications Reconciled    Vitals (Chris [name] CMA; 06/22/2019 4:26 PM) 06/22/2019 4:26 PM Weight: 211.5 lb Height: 70in Body Surface Area: 2.14 m Body Mass Index: 30.35 kg/m  Temp.: 98.14F  Pulse: 76 (Regular)  P.OX: 98% (Room air) BP: 140/70 (Sitting, Left Arm, Standard)        Physical Exam Chris Hector MD; 06/22/2019 5:26 PM)  General Mental Status-Alert. General Appearance-Not in acute distress, Not Sickly. Orientation-Oriented X3. Hydration-Well hydrated. Voice-Normal.  Integumentary Global Assessment Upon inspection and palpation of skin surfaces of the - Axillae: non-tender, no inflammation or ulceration, no drainage.  and Distribution of scalp and body hair is normal. General Characteristics Temperature - normal warmth  is noted.  Head and Neck Head-normocephalic, atraumatic with no lesions or palpable masses. Face Global Assessment - atraumatic, no absence of expression. Neck Global Assessment - no abnormal movements, no bruit auscultated on the right, no bruit auscultated on the left, no decreased range of motion, non-tender. Trachea-midline. Thyroid Gland Characteristics - non-tender.  Eye Eyeball - Left-Extraocular movements intact, No Nystagmus - Left. Eyeball - Right-Extraocular movements intact, No Nystagmus - Right. Cornea - Left-No Hazy - Left. Cornea - Right-No Hazy - Right. Sclera/Conjunctiva - Left-No scleral icterus, No Discharge - Left. Sclera/Conjunctiva - Right-No scleral icterus, No Discharge - Right. Pupil - Left-Direct reaction to light normal. Pupil - Right-Direct reaction to light normal.  ENMT Ears Pinna - Left - no drainage observed, no generalized tenderness observed. Pinna - Right - no drainage observed, no generalized tenderness observed. Nose and Sinuses External Inspection of the Nose - no destructive lesion observed. Inspection of the nares - Left - quiet respiration. Inspection of the nares - Right - quiet respiration. Mouth and Throat Lips - Upper Lip - no fissures observed, no pallor noted. Lower Lip - no fissures observed, no pallor noted. Nasopharynx - no discharge present. Oral Cavity/Oropharynx - Tongue - no dryness observed. Oral Mucosa - no cyanosis observed. Hypopharynx - no evidence of airway distress observed.  Chest and Lung Exam Inspection Movements - Normal and Symmetrical. Accessory muscles - No use of accessory muscles in breathing. Palpation Palpation of the chest reveals - Non-tender. Auscultation Breath sounds - Normal and Clear.  Cardiovascular Auscultation Rhythm - Regular. Murmurs & Other Heart Sounds - Auscultation of  the heart reveals - No Murmurs and No Systolic Clicks.  Abdomen Inspection Inspection of the abdomen reveals - No Visible peristalsis and No Abnormal pulsations. Umbilicus - No Bleeding, No Urine drainage. Palpation/Percussion Palpation and Percussion of the abdomen reveal - Soft, Non Tender, No Rebound tenderness, No Rigidity (guarding) and No Cutaneous hyperesthesia. Note: Abdomen soft. Nontender. Not distended. No umbilical or incisional hernias. No guarding.  Male Genitourinary Sexual Maturity Tanner 5 - Adult hair pattern and Adult penile size and shape. Note: No inguinal lymphadenopathy. 2.5x2 cm firm yellow subcutaneous nodule on scrotum consistent with chronic sebaceous cyst. Epididymides testes spermatic cords otherwise normal. No obvious inguinal hernias.  Rectal Note: Please refer to anoscopy.  Right posterior inflamed internal hemorrhoid thickened and friable with firm 1cm nodule in the center of it. Friable and easily bleeds. Significant external component as well. Left lateral external hemorrhoid. At least grade 2 right anterior hemorrhoid. Grade 1/2 left lateral internal hemorrhoid. Very sensitive to digital & anoscopy examination. Cannot tolerate anoscopy very well  No fissure. A fistula. No obvious rectal masses felt to 5 cm by digital exam. Prostate smooth without any obvious masses.  Peripheral Vascular Upper Extremity Inspection - Left - No Cyanotic nailbeds - Left, Not Ischemic. Inspection - Right - No Cyanotic nailbeds - Right, Not Ischemic.  Neurologic Neurologic evaluation reveals -normal attention span and ability to concentrate, able to name objects and repeat phrases. Appropriate fund of knowledge , normal sensation and normal coordination. Mental Status Affect - not angry, not paranoid. Cranial Nerves-Normal Bilaterally. Gait-Normal.  Neuropsychiatric Mental status exam performed with findings of-able to articulate well with normal  speech/language, rate, volume and coherence, thought content normal with ability to perform basic computations and apply abstract reasoning and no evidence of hallucinations, delusions, obsessions or homicidal/suicidal ideation.  Musculoskeletal Global Assessment Spine, Ribs and Pelvis - no instability, subluxation or laxity. Right Upper Extremity -  no instability, subluxation or laxity.  Lymphatic Head & Neck  General Head & Neck Lymphatics: Bilateral - Description - No Localized lymphadenopathy. Axillary  General Axillary Region: Bilateral - Description - No Localized lymphadenopathy. Femoral & Inguinal  Generalized Femoral & Inguinal Lymphatics: Left - Description - No Localized lymphadenopathy. Right - Description - No Localized lymphadenopathy.    Assessment & Plan Chris Hector MD; 06/22/2019 5:23 PM)  INTERNAL BLEEDING HEMORRHOIDS (K64.8) Story: History of internal bleeding hemorrhoids requiring transfusions and iron supplementation. Repeated banding's with persistent recurrent bleeding Impression: Another episode of significant rectal bleeding. Right posterior inflamed hemorrhoid at least partially prolapses out now with hard nodule on it. External component as well. 2 sensitive to tolerate anoscopic exam remains not going to be a candidate for banding.  Agree with Dr. Marcello Moores from last year that he is at the point where he needs surgery to help address the anatomy and the bleeding. Outpatient surgery. Hemorrhoidal ligation/pexy. Probable excision of external components as well to help normalize the anatomy. Remove and biopsy the hard nodule of the largest bleeding hemorrhoid. He is more open to considering this time  Current Plans ANOSCOPY, DIAGNOSTIC (38101) Pt Education - Pamphlet Given - The Hemorrhoid Book: discussed with patient and provided information. The anatomy & physiology of the anorectal region was discussed. The pathophysiology of hemorrhoids and differential  diagnosis was discussed. Natural history risks without surgery was discussed. I stressed the importance of a bowel regimen to have daily soft bowel movements to minimize progression of disease. Interventions such as sclerotherapy & banding were discussed.  The patient's symptoms are not adequately controlled by medicines and other non-operative treatments. I feel the risks & problems of no surgery outweigh the operative risks; therefore, I recommended surgery to treat the hemorrhoids by ligation, pexy, and possible resection.  Risks such as bleeding, infection, urinary difficulties, need for further treatment, heart attack, death, and other risks were discussed. I noted a good likelihood this will help address the problem. Goals of post-operative recovery were discussed as well. Possibility that this will not correct all symptoms was explained. Post-operative pain, bleeding, constipation, and other problems after surgery were discussed. We will work to minimize complications. Educational handouts further explaining the pathology, treatment options, and bowel regimen were given as well. Questions were answered. The patient expresses understanding & wishes to proceed with surgery.  Pt Education - [facility] Hemorrhoids ([place] K[place]): discussed with patient and provided information. You are being scheduled for surgery- Our schedulers will call you.  You should hear from our office's scheduling department within 5 working days about the location, date, and time of surgery. We try to make accommodations for patient's preferences in scheduling surgery, but sometimes the OR schedule or the surgeon's schedule prevents [place] from making those accommodations.  If you have not heard from our office [phone]) in 5 working days, call the office and ask for your surgeon's nurse.  If you have other questions about your diagnosis, plan, or surgery, call the office and ask for your surgeon's nurse.    PROLAPSED INTERNAL HEMORRHOIDS, GRADE 3 (K64.2)   PROLAPSED INTERNAL HEMORRHOIDS, GRADE 2 (K64.1)   EXTERNAL HEMORRHOIDS WITH COMPLICATION (P82.4)   ENCOUNTER FOR PREOPERATIVE EXAMINATION FOR GENERAL SURGICAL PROCEDURE (Z01.818)  Current Plans Pt Education - [facility] Rectal Prep for Anorectal outpatient/office surgery: discussed with patient and provided information. Pt Education - [facility] Rectal Surgery HCI (Mercedez Boule): discussed with patient and provided information. Pt Education - [facility] Good Bowel Health ([place] Cook[place])  SCROTAL MASS (N50.89)  Impression: Mass on scrotum of skin. Smooth & Very superficial. Yellow some firmness most likely consistent with chronic sebaceous cyst. Can remove at the time of hemorrhoid surgery if he wishes.  Chris Hector, MD, FACS, MASCRS Gastrointestinal and Minimally Invasive Surgery    [address] [place], [place] [address] [phone] Main / Paging [phone] Fax
